# Patient Record
Sex: Male | Born: 1945
Health system: Southern US, Community
[De-identification: ages and names within clinical notes are randomized; demographics above are authoritative.]

## PROBLEM LIST (undated history)

## (undated) DIAGNOSIS — I712 Thoracic aortic aneurysm, without rupture, unspecified: Secondary | ICD-10-CM

## (undated) DIAGNOSIS — R6889 Other general symptoms and signs: Secondary | ICD-10-CM

## (undated) DIAGNOSIS — Z87442 Personal history of urinary calculi: Secondary | ICD-10-CM

## (undated) DIAGNOSIS — N400 Enlarged prostate without lower urinary tract symptoms: Secondary | ICD-10-CM

## (undated) DIAGNOSIS — I10 Essential (primary) hypertension: Secondary | ICD-10-CM

## (undated) DIAGNOSIS — I2510 Atherosclerotic heart disease of native coronary artery without angina pectoris: Secondary | ICD-10-CM

## (undated) DIAGNOSIS — E785 Hyperlipidemia, unspecified: Secondary | ICD-10-CM

## (undated) DIAGNOSIS — I5189 Other ill-defined heart diseases: Secondary | ICD-10-CM

## (undated) DIAGNOSIS — C4490 Unspecified malignant neoplasm of skin, unspecified: Secondary | ICD-10-CM

## (undated) DIAGNOSIS — K5730 Diverticulosis of large intestine without perforation or abscess without bleeding: Secondary | ICD-10-CM

## (undated) HISTORY — PX: APPENDECTOMY: SHX54

## (undated) HISTORY — DX: Thoracic aortic aneurysm, without rupture, unspecified: I71.20

## (undated) HISTORY — DX: Essential (primary) hypertension: I10

## (undated) HISTORY — PX: VASECTOMY: SHX75

## (undated) HISTORY — PX: MOHS SURGERY: SHX181

## (undated) HISTORY — DX: Unspecified malignant neoplasm of skin, unspecified: C44.90

## (undated) HISTORY — DX: Other ill-defined heart diseases: I51.89

## (undated) HISTORY — DX: Hyperlipidemia, unspecified: E78.5

## (undated) HISTORY — PX: HERNIA REPAIR: SHX51

## (undated) HISTORY — DX: Benign prostatic hyperplasia without lower urinary tract symptoms: N40.0

## (undated) HISTORY — DX: Other general symptoms and signs: R68.89

## (undated) HISTORY — DX: Diverticulosis of large intestine without perforation or abscess without bleeding: K57.30

## (undated) HISTORY — DX: Atherosclerotic heart disease of native coronary artery without angina pectoris: I25.10

## (undated) HISTORY — PX: COLON SURGERY: SHX602

## (undated) HISTORY — DX: Thoracic aortic aneurysm, without rupture: I71.2

---

## 2016-02-06 HISTORY — PX: CATARACT EXTRACTION: SUR2

## 2016-04-16 DIAGNOSIS — E782 Mixed hyperlipidemia: Secondary | ICD-10-CM | POA: Diagnosis not present

## 2016-04-16 DIAGNOSIS — Z125 Encounter for screening for malignant neoplasm of prostate: Secondary | ICD-10-CM | POA: Diagnosis not present

## 2016-04-16 DIAGNOSIS — I10 Essential (primary) hypertension: Secondary | ICD-10-CM | POA: Diagnosis not present

## 2016-04-23 DIAGNOSIS — E782 Mixed hyperlipidemia: Secondary | ICD-10-CM | POA: Diagnosis not present

## 2016-04-23 DIAGNOSIS — Z1211 Encounter for screening for malignant neoplasm of colon: Secondary | ICD-10-CM | POA: Diagnosis not present

## 2016-04-23 DIAGNOSIS — M545 Low back pain: Secondary | ICD-10-CM | POA: Diagnosis not present

## 2016-04-23 DIAGNOSIS — Z0000 Encounter for general adult medical examination without abnormal findings: Secondary | ICD-10-CM | POA: Diagnosis not present

## 2016-04-23 DIAGNOSIS — I10 Essential (primary) hypertension: Secondary | ICD-10-CM | POA: Diagnosis not present

## 2016-04-23 DIAGNOSIS — N401 Enlarged prostate with lower urinary tract symptoms: Secondary | ICD-10-CM | POA: Diagnosis not present

## 2016-05-29 DIAGNOSIS — R195 Other fecal abnormalities: Secondary | ICD-10-CM | POA: Diagnosis not present

## 2016-05-29 DIAGNOSIS — K921 Melena: Secondary | ICD-10-CM | POA: Diagnosis not present

## 2016-06-13 DIAGNOSIS — Z85828 Personal history of other malignant neoplasm of skin: Secondary | ICD-10-CM | POA: Diagnosis not present

## 2016-06-13 DIAGNOSIS — L853 Xerosis cutis: Secondary | ICD-10-CM | POA: Diagnosis not present

## 2016-06-13 DIAGNOSIS — L570 Actinic keratosis: Secondary | ICD-10-CM | POA: Diagnosis not present

## 2016-06-13 DIAGNOSIS — D1801 Hemangioma of skin and subcutaneous tissue: Secondary | ICD-10-CM | POA: Diagnosis not present

## 2016-06-27 DIAGNOSIS — H25042 Posterior subcapsular polar age-related cataract, left eye: Secondary | ICD-10-CM | POA: Diagnosis not present

## 2016-06-27 DIAGNOSIS — H2513 Age-related nuclear cataract, bilateral: Secondary | ICD-10-CM | POA: Diagnosis not present

## 2016-06-27 DIAGNOSIS — H52221 Regular astigmatism, right eye: Secondary | ICD-10-CM | POA: Diagnosis not present

## 2016-06-27 DIAGNOSIS — H25013 Cortical age-related cataract, bilateral: Secondary | ICD-10-CM | POA: Diagnosis not present

## 2016-07-09 DIAGNOSIS — D124 Benign neoplasm of descending colon: Secondary | ICD-10-CM | POA: Diagnosis not present

## 2016-07-09 DIAGNOSIS — R195 Other fecal abnormalities: Secondary | ICD-10-CM | POA: Diagnosis not present

## 2016-07-09 DIAGNOSIS — K635 Polyp of colon: Secondary | ICD-10-CM | POA: Diagnosis not present

## 2016-07-09 DIAGNOSIS — K648 Other hemorrhoids: Secondary | ICD-10-CM | POA: Diagnosis not present

## 2016-11-10 DIAGNOSIS — Z23 Encounter for immunization: Secondary | ICD-10-CM | POA: Diagnosis not present

## 2016-12-03 DIAGNOSIS — D126 Benign neoplasm of colon, unspecified: Secondary | ICD-10-CM | POA: Diagnosis not present

## 2016-12-03 DIAGNOSIS — K5730 Diverticulosis of large intestine without perforation or abscess without bleeding: Secondary | ICD-10-CM | POA: Diagnosis not present

## 2016-12-03 DIAGNOSIS — E7849 Other hyperlipidemia: Secondary | ICD-10-CM | POA: Diagnosis not present

## 2016-12-03 DIAGNOSIS — I10 Essential (primary) hypertension: Secondary | ICD-10-CM | POA: Diagnosis not present

## 2016-12-03 DIAGNOSIS — N401 Enlarged prostate with lower urinary tract symptoms: Secondary | ICD-10-CM | POA: Diagnosis not present

## 2016-12-03 DIAGNOSIS — C4490 Unspecified malignant neoplasm of skin, unspecified: Secondary | ICD-10-CM | POA: Diagnosis not present

## 2016-12-03 DIAGNOSIS — Z1389 Encounter for screening for other disorder: Secondary | ICD-10-CM | POA: Diagnosis not present

## 2016-12-03 DIAGNOSIS — E785 Hyperlipidemia, unspecified: Secondary | ICD-10-CM | POA: Insufficient documentation

## 2016-12-03 DIAGNOSIS — R7301 Impaired fasting glucose: Secondary | ICD-10-CM | POA: Diagnosis not present

## 2016-12-03 DIAGNOSIS — M545 Low back pain: Secondary | ICD-10-CM | POA: Diagnosis not present

## 2016-12-03 DIAGNOSIS — Z6825 Body mass index (BMI) 25.0-25.9, adult: Secondary | ICD-10-CM | POA: Diagnosis not present

## 2016-12-18 DIAGNOSIS — H2513 Age-related nuclear cataract, bilateral: Secondary | ICD-10-CM | POA: Diagnosis not present

## 2016-12-18 DIAGNOSIS — H524 Presbyopia: Secondary | ICD-10-CM | POA: Diagnosis not present

## 2017-02-13 DIAGNOSIS — H25012 Cortical age-related cataract, left eye: Secondary | ICD-10-CM | POA: Diagnosis not present

## 2017-02-13 DIAGNOSIS — H25812 Combined forms of age-related cataract, left eye: Secondary | ICD-10-CM | POA: Diagnosis not present

## 2017-02-13 DIAGNOSIS — H2512 Age-related nuclear cataract, left eye: Secondary | ICD-10-CM | POA: Diagnosis not present

## 2017-03-20 DIAGNOSIS — H2511 Age-related nuclear cataract, right eye: Secondary | ICD-10-CM | POA: Diagnosis not present

## 2017-03-20 DIAGNOSIS — H25811 Combined forms of age-related cataract, right eye: Secondary | ICD-10-CM | POA: Diagnosis not present

## 2017-05-13 DIAGNOSIS — I10 Essential (primary) hypertension: Secondary | ICD-10-CM | POA: Diagnosis not present

## 2017-05-13 DIAGNOSIS — E7849 Other hyperlipidemia: Secondary | ICD-10-CM | POA: Diagnosis not present

## 2017-05-13 DIAGNOSIS — Z125 Encounter for screening for malignant neoplasm of prostate: Secondary | ICD-10-CM | POA: Diagnosis not present

## 2017-05-13 DIAGNOSIS — R7301 Impaired fasting glucose: Secondary | ICD-10-CM | POA: Diagnosis not present

## 2017-05-13 DIAGNOSIS — R82998 Other abnormal findings in urine: Secondary | ICD-10-CM | POA: Diagnosis not present

## 2017-05-21 DIAGNOSIS — M545 Low back pain: Secondary | ICD-10-CM | POA: Diagnosis not present

## 2017-05-21 DIAGNOSIS — E7849 Other hyperlipidemia: Secondary | ICD-10-CM | POA: Diagnosis not present

## 2017-05-21 DIAGNOSIS — D126 Benign neoplasm of colon, unspecified: Secondary | ICD-10-CM | POA: Diagnosis not present

## 2017-05-21 DIAGNOSIS — Z6826 Body mass index (BMI) 26.0-26.9, adult: Secondary | ICD-10-CM | POA: Diagnosis not present

## 2017-05-21 DIAGNOSIS — C4490 Unspecified malignant neoplasm of skin, unspecified: Secondary | ICD-10-CM | POA: Diagnosis not present

## 2017-05-21 DIAGNOSIS — I10 Essential (primary) hypertension: Secondary | ICD-10-CM | POA: Diagnosis not present

## 2017-05-21 DIAGNOSIS — N401 Enlarged prostate with lower urinary tract symptoms: Secondary | ICD-10-CM | POA: Diagnosis not present

## 2017-05-21 DIAGNOSIS — Z1389 Encounter for screening for other disorder: Secondary | ICD-10-CM | POA: Diagnosis not present

## 2017-05-21 DIAGNOSIS — Z0000 Encounter for general adult medical examination without abnormal findings: Secondary | ICD-10-CM | POA: Diagnosis not present

## 2017-05-21 DIAGNOSIS — R7301 Impaired fasting glucose: Secondary | ICD-10-CM | POA: Diagnosis not present

## 2017-08-16 DIAGNOSIS — L249 Irritant contact dermatitis, unspecified cause: Secondary | ICD-10-CM | POA: Diagnosis not present

## 2017-08-16 DIAGNOSIS — L720 Epidermal cyst: Secondary | ICD-10-CM | POA: Diagnosis not present

## 2017-08-16 DIAGNOSIS — D485 Neoplasm of uncertain behavior of skin: Secondary | ICD-10-CM | POA: Diagnosis not present

## 2017-08-16 DIAGNOSIS — D1801 Hemangioma of skin and subcutaneous tissue: Secondary | ICD-10-CM | POA: Diagnosis not present

## 2017-08-16 DIAGNOSIS — L821 Other seborrheic keratosis: Secondary | ICD-10-CM | POA: Diagnosis not present

## 2017-08-16 DIAGNOSIS — L570 Actinic keratosis: Secondary | ICD-10-CM | POA: Diagnosis not present

## 2017-08-16 DIAGNOSIS — Z85828 Personal history of other malignant neoplasm of skin: Secondary | ICD-10-CM | POA: Diagnosis not present

## 2017-08-16 DIAGNOSIS — D692 Other nonthrombocytopenic purpura: Secondary | ICD-10-CM | POA: Diagnosis not present

## 2017-08-16 DIAGNOSIS — L738 Other specified follicular disorders: Secondary | ICD-10-CM | POA: Diagnosis not present

## 2017-08-16 DIAGNOSIS — C44329 Squamous cell carcinoma of skin of other parts of face: Secondary | ICD-10-CM | POA: Diagnosis not present

## 2017-09-18 DIAGNOSIS — C4442 Squamous cell carcinoma of skin of scalp and neck: Secondary | ICD-10-CM | POA: Diagnosis not present

## 2017-09-18 DIAGNOSIS — Z85828 Personal history of other malignant neoplasm of skin: Secondary | ICD-10-CM | POA: Diagnosis not present

## 2017-10-28 DIAGNOSIS — Z961 Presence of intraocular lens: Secondary | ICD-10-CM | POA: Diagnosis not present

## 2017-12-09 DIAGNOSIS — Z23 Encounter for immunization: Secondary | ICD-10-CM | POA: Diagnosis not present

## 2018-05-21 ENCOUNTER — Encounter: Payer: Self-pay | Admitting: Cardiovascular Disease

## 2018-05-21 DIAGNOSIS — I10 Essential (primary) hypertension: Secondary | ICD-10-CM | POA: Diagnosis not present

## 2018-05-21 DIAGNOSIS — R7301 Impaired fasting glucose: Secondary | ICD-10-CM | POA: Diagnosis not present

## 2018-05-21 DIAGNOSIS — R82998 Other abnormal findings in urine: Secondary | ICD-10-CM | POA: Diagnosis not present

## 2018-05-21 DIAGNOSIS — E7849 Other hyperlipidemia: Secondary | ICD-10-CM | POA: Diagnosis not present

## 2018-05-21 DIAGNOSIS — Z125 Encounter for screening for malignant neoplasm of prostate: Secondary | ICD-10-CM | POA: Diagnosis not present

## 2018-05-28 DIAGNOSIS — R7301 Impaired fasting glucose: Secondary | ICD-10-CM | POA: Diagnosis not present

## 2018-05-28 DIAGNOSIS — M545 Low back pain: Secondary | ICD-10-CM | POA: Diagnosis not present

## 2018-05-28 DIAGNOSIS — N401 Enlarged prostate with lower urinary tract symptoms: Secondary | ICD-10-CM | POA: Diagnosis not present

## 2018-05-28 DIAGNOSIS — D126 Benign neoplasm of colon, unspecified: Secondary | ICD-10-CM | POA: Diagnosis not present

## 2018-05-28 DIAGNOSIS — E785 Hyperlipidemia, unspecified: Secondary | ICD-10-CM | POA: Diagnosis not present

## 2018-05-28 DIAGNOSIS — I10 Essential (primary) hypertension: Secondary | ICD-10-CM | POA: Diagnosis not present

## 2018-05-28 DIAGNOSIS — Z1331 Encounter for screening for depression: Secondary | ICD-10-CM | POA: Diagnosis not present

## 2018-05-28 DIAGNOSIS — Z0000 Encounter for general adult medical examination without abnormal findings: Secondary | ICD-10-CM | POA: Diagnosis not present

## 2018-05-28 DIAGNOSIS — Z1339 Encounter for screening examination for other mental health and behavioral disorders: Secondary | ICD-10-CM | POA: Diagnosis not present

## 2018-05-28 DIAGNOSIS — C4490 Unspecified malignant neoplasm of skin, unspecified: Secondary | ICD-10-CM | POA: Diagnosis not present

## 2018-05-28 DIAGNOSIS — G3184 Mild cognitive impairment, so stated: Secondary | ICD-10-CM | POA: Diagnosis not present

## 2018-10-01 DIAGNOSIS — Z85828 Personal history of other malignant neoplasm of skin: Secondary | ICD-10-CM | POA: Diagnosis not present

## 2018-10-01 DIAGNOSIS — L570 Actinic keratosis: Secondary | ICD-10-CM | POA: Diagnosis not present

## 2018-10-01 DIAGNOSIS — L603 Nail dystrophy: Secondary | ICD-10-CM | POA: Diagnosis not present

## 2018-10-01 DIAGNOSIS — D485 Neoplasm of uncertain behavior of skin: Secondary | ICD-10-CM | POA: Diagnosis not present

## 2018-10-01 DIAGNOSIS — D225 Melanocytic nevi of trunk: Secondary | ICD-10-CM | POA: Diagnosis not present

## 2018-10-01 DIAGNOSIS — C44519 Basal cell carcinoma of skin of other part of trunk: Secondary | ICD-10-CM | POA: Diagnosis not present

## 2018-10-01 DIAGNOSIS — B353 Tinea pedis: Secondary | ICD-10-CM | POA: Diagnosis not present

## 2018-10-01 DIAGNOSIS — L821 Other seborrheic keratosis: Secondary | ICD-10-CM | POA: Diagnosis not present

## 2018-10-01 DIAGNOSIS — C44319 Basal cell carcinoma of skin of other parts of face: Secondary | ICD-10-CM | POA: Diagnosis not present

## 2018-10-01 DIAGNOSIS — D1801 Hemangioma of skin and subcutaneous tissue: Secondary | ICD-10-CM | POA: Diagnosis not present

## 2018-10-18 DIAGNOSIS — Z23 Encounter for immunization: Secondary | ICD-10-CM | POA: Diagnosis not present

## 2018-10-22 DIAGNOSIS — Z85828 Personal history of other malignant neoplasm of skin: Secondary | ICD-10-CM | POA: Diagnosis not present

## 2018-10-22 DIAGNOSIS — C441192 Basal cell carcinoma of skin of left lower eyelid, including canthus: Secondary | ICD-10-CM | POA: Diagnosis not present

## 2018-10-22 DIAGNOSIS — D485 Neoplasm of uncertain behavior of skin: Secondary | ICD-10-CM | POA: Diagnosis not present

## 2018-10-22 DIAGNOSIS — D2371 Other benign neoplasm of skin of right lower limb, including hip: Secondary | ICD-10-CM | POA: Diagnosis not present

## 2018-10-31 DIAGNOSIS — Z961 Presence of intraocular lens: Secondary | ICD-10-CM | POA: Diagnosis not present

## 2018-12-26 DIAGNOSIS — D225 Melanocytic nevi of trunk: Secondary | ICD-10-CM | POA: Diagnosis not present

## 2018-12-26 DIAGNOSIS — D1801 Hemangioma of skin and subcutaneous tissue: Secondary | ICD-10-CM | POA: Diagnosis not present

## 2018-12-26 DIAGNOSIS — Z85828 Personal history of other malignant neoplasm of skin: Secondary | ICD-10-CM | POA: Diagnosis not present

## 2019-01-12 ENCOUNTER — Other Ambulatory Visit: Payer: Self-pay | Admitting: *Deleted

## 2019-01-12 DIAGNOSIS — Z8249 Family history of ischemic heart disease and other diseases of the circulatory system: Secondary | ICD-10-CM

## 2019-01-27 ENCOUNTER — Other Ambulatory Visit: Payer: Self-pay

## 2019-01-27 ENCOUNTER — Ambulatory Visit (INDEPENDENT_AMBULATORY_CARE_PROVIDER_SITE_OTHER)
Admission: RE | Admit: 2019-01-27 | Discharge: 2019-01-27 | Disposition: A | Discharge status: Home / Self Care | Payer: Self-pay | Source: Ambulatory Visit | Attending: Cardiovascular Disease | Admitting: Cardiovascular Disease

## 2019-01-27 DIAGNOSIS — Z8249 Family history of ischemic heart disease and other diseases of the circulatory system: Secondary | ICD-10-CM

## 2019-02-03 ENCOUNTER — Telehealth: Payer: Self-pay | Admitting: Cardiovascular Disease

## 2019-02-03 NOTE — Telephone Encounter (Signed)
Spoke with patient and reviewed that results are pending provider review. Advised that once he reviews then we would give him a call back. I did give him a preliminary review with the understanding that Dr. Rockey Situ would look at as well. He verbalized understanding of our conversation, agreement with plan, and had no further questions at this time.

## 2019-02-03 NOTE — Telephone Encounter (Signed)
Patient had a Cardiac CT and needs results. Please call to discuss.

## 2019-02-10 ENCOUNTER — Telehealth: Payer: Self-pay

## 2019-02-10 NOTE — Telephone Encounter (Signed)
-----   Message from Minna Merritts, MD sent at 02/05/2019  2:15 PM EST ----- Calcium score elevated at close to 500 Also with mildly dilated aorta 4 cm Would recommend new patient visit to establish care Needs aggressive lipid management, risk factor modification He could certainly discuss this with Dr. Forde Dandy

## 2019-02-10 NOTE — Telephone Encounter (Signed)
Call to patient to discuss results. He verbalized understanding and is cooperative with POC>  appt made for mon 1/11 @ 3:20 PM.  No further questions at this time.

## 2019-02-11 ENCOUNTER — Encounter: Payer: Self-pay | Admitting: Cardiovascular Disease

## 2019-02-13 NOTE — Progress Notes (Signed)
Cardiology Office Note  Date:  02/16/2019   ID:  Alexander Frazier, DOB [birth year], MRN [id number]  PCP:  Reynold Bowen, MD   Chief Complaint  Patient presents with  . other    Discuss CT scoring no complaints today. Meds reviewed verbally with pt.    HPI:  Alexander Frazier is a 74 year old gentleman with history of Hyperlipidemia Presenting for new patient evaluation to discuss his CT coronary calcium scoring  CT scan images pulled up and reviewed with him  calcium score 489, 64th percentile Ascending thoracic aorta borderline dilated at 4 cm. calcification all three vessels  Followed by Dr. Forde Dandy Lab work has been requested LDL 87, total chol 156 On simvastatin 10 mg daily  In the past very active Taught spinning at age 89 He denies having any anginal symptoms   Reports he is always had an abnormal EKG  has previous stress test for ABN EKG prior EKGs in [place], [place],  [facility]  EKG personally reviewed by myself on todays visit Shows normal sinus rhythm rate 89 bpm T wave abnormality through the precordial leads, inferior leads   PMH:   has a past medical history of Hyperlipidemia, Hypertension, and Skin cancer.  PSH:    Past Surgical History:  Procedure Laterality Date  . APPENDECTOMY    . HERNIA REPAIR    . MOHS SURGERY      Current Outpatient Medications  Medication Sig Dispense Refill  . enalapril (VASOTEC) 5 MG tablet Take 5 mg by mouth daily.    . simvastatin (ZOCOR) 20 MG tablet Take 1 tablet (20 mg total) by mouth daily. 90 tablet 3  . ezetimibe (ZETIA) 10 MG tablet Take 1 tablet (10 mg total) by mouth daily. 90 tablet 3   No current facility-administered medications for this visit.    Allergies:   Patient has no known allergies.   Social History:  The patient  reports that he has quit smoking. His smoking use included cigarettes. He quit after 2.00 years of use. He has never used smokeless tobacco. He  reports previous alcohol use. He reports that he does not use drugs.   Family History:   family history includes Bone cancer in his mother; Breast cancer in his mother; Dementia in his father; Hypertension in his father and mother.    Review of Systems: Review of Systems  Constitutional: Negative.   HENT: Negative.   Respiratory: Negative.   Cardiovascular: Negative.   Gastrointestinal: Negative.   Musculoskeletal: Positive for back pain.  Neurological: Negative.   Psychiatric/Behavioral: Negative.   All other systems reviewed and are negative.   PHYSICAL EXAM: VS:  BP 136/78 (BP Location: Right Arm, Patient Position: Sitting, Cuff Size: Normal)   Pulse 89   Ht 5\' 11"  (1.803 m)   Wt 174 lb 12 oz (79.3 kg)   SpO2 98%   BMI 24.37 kg/m  , BMI Body mass index is 24.37 kg/m. GEN: Well nourished, well developed, in no acute distress HEENT: normal Neck: no JVD, carotid bruits, or masses Cardiac: RRR; no murmurs, rubs, or gallops,no edema  Respiratory:  clear to auscultation bilaterally, normal work of breathing GI: soft, nontender, nondistended, + BS MS: no deformity or atrophy Skin: warm and dry, no rash Neuro:  Strength and sensation are intact Psych: euthymic mood, full affect  Recent Labs: No results found for requested labs within last 8760 hours.    Lipid Panel No results found for: CHOL, HDL, LDLCALC,  TRIG    Wt Readings from Last 3 Encounters:  02/16/19 174 lb 12 oz (79.3 kg)     ASSESSMENT AND PLAN:  Problem List Items Addressed This Visit    None    Visit Diagnoses    Coronary artery calcification seen on CAT scan    -  Primary   Relevant Medications   enalapril (VASOTEC) 5 MG tablet   ezetimibe (ZETIA) 10 MG tablet   simvastatin (ZOCOR) 20 MG tablet   Other Relevant Orders   EKG 12-Lead   Mixed hyperlipidemia       Relevant Medications   enalapril (VASOTEC) 5 MG tablet   ezetimibe (ZETIA) 10 MG tablet   simvastatin (ZOCOR) 20 MG tablet    Essential hypertension       Relevant Medications   enalapril (VASOTEC) 5 MG tablet   ezetimibe (ZETIA) 10 MG tablet   simvastatin (ZOCOR) 20 MG tablet     Coronary calcification on CT scan Images reviewed with him on today's visit Goal LDL 60 Not having anginal sx We will request old records for ABN EKG. Reports having stress tests (has had 7 to 8 over the years) Restart asa 81 mg daily Increase simvastatin up to 20 mg daily, add zetia 10 mg daily  Hyperlipidemia Goal LDL <60 Increase simvasvatin , add zetia  HTN Blood pressure is well controlled on today's visit. No changes made to the medications. Stable  Abnormal EKG Reports long history of abnormal EKG dating back many years with periodic stress testing for abnormal EKG Records requested   Disposition:   F/U as needed  Long discussion with him concerning need for more aggressive lipid panel, discussed anginal symptoms to watch for Records have been requested from [place] to correlate EKGs with his stress testing  Total encounter time more than 60 minutes  Greater than 50% was spent in counseling and coordination of care with the patient    Signed, Esmond Plants, M.D., Ph.D. [place], [place]

## 2019-02-16 ENCOUNTER — Encounter: Payer: Self-pay | Admitting: Cardiovascular Disease

## 2019-02-16 ENCOUNTER — Ambulatory Visit (INDEPENDENT_AMBULATORY_CARE_PROVIDER_SITE_OTHER): Payer: Medicare Other | Admitting: Cardiovascular Disease

## 2019-02-16 ENCOUNTER — Other Ambulatory Visit: Payer: Self-pay

## 2019-02-16 VITALS — BP 136/78 | HR 89 | Ht 71.0 in | Wt 174.8 lb

## 2019-02-16 DIAGNOSIS — E782 Mixed hyperlipidemia: Secondary | ICD-10-CM

## 2019-02-16 DIAGNOSIS — I25118 Atherosclerotic heart disease of native coronary artery with other forms of angina pectoris: Secondary | ICD-10-CM | POA: Diagnosis not present

## 2019-02-16 DIAGNOSIS — R9431 Abnormal electrocardiogram [ECG] [EKG]: Secondary | ICD-10-CM

## 2019-02-16 DIAGNOSIS — I2510 Atherosclerotic heart disease of native coronary artery without angina pectoris: Secondary | ICD-10-CM | POA: Diagnosis not present

## 2019-02-16 DIAGNOSIS — I10 Essential (primary) hypertension: Secondary | ICD-10-CM

## 2019-02-16 MED ORDER — EZETIMIBE 10 MG PO TABS: 10.0000 mg | ORAL_TABLET | Freq: Every day | ORAL | 3 refills | Status: DC

## 2019-02-16 MED ORDER — SIMVASTATIN 20 MG PO TABS: 20.0000 mg | ORAL_TABLET | Freq: Every day | ORAL | 3 refills | Status: DC

## 2019-02-16 NOTE — Patient Instructions (Signed)
Request records: EKGs and stress test, [place]   Medication Instructions:  Please increase the simvastatin up to 20 mg daily Start zetia 10 mg daily  If you need a refill on your cardiac medications before your next appointment, please call your pharmacy.    Lab work: No new labs needed   If you have labs (blood work) drawn today and your tests are completely normal, you will receive your results only by: [facility] Kitchen MyChart Message (if you have MyChart) OR . A paper copy in the mail If you have any lab test that is abnormal or we need to change your treatment, we will call you to review the results.   Testing/Procedures: No new testing needed   Follow-Up: At [facility], you and your health needs are our priority.  As part of our continuing mission to provide you with exceptional heart care, we have created designated Provider Care Teams.  These Care Teams include your primary Cardiologist (physician) and Advanced Practice Providers (APPs -  Physician Assistants and Nurse Practitioners) who all work together to provide you with the care you need, when you need it.  . You will need a follow up appointment as needed   . Providers on your designated Care Team:   . Murray Hodgkins, NP . Christell Faith, PA-C . Marrianne Mood, PA-C  Any Other Special Instructions Will Be Listed Below (If Applicable).  For educational health videos Log in to : [email or web address] Or : [email or web address], password : [email or web address]

## 2019-05-25 DIAGNOSIS — R7301 Impaired fasting glucose: Secondary | ICD-10-CM | POA: Diagnosis not present

## 2019-05-25 DIAGNOSIS — E7849 Other hyperlipidemia: Secondary | ICD-10-CM | POA: Diagnosis not present

## 2019-05-25 DIAGNOSIS — Z125 Encounter for screening for malignant neoplasm of prostate: Secondary | ICD-10-CM | POA: Diagnosis not present

## 2019-06-01 DIAGNOSIS — I10 Essential (primary) hypertension: Secondary | ICD-10-CM | POA: Diagnosis not present

## 2019-06-01 DIAGNOSIS — G3184 Mild cognitive impairment, so stated: Secondary | ICD-10-CM | POA: Diagnosis not present

## 2019-06-01 DIAGNOSIS — E7849 Other hyperlipidemia: Secondary | ICD-10-CM | POA: Diagnosis not present

## 2019-06-01 DIAGNOSIS — R82998 Other abnormal findings in urine: Secondary | ICD-10-CM | POA: Diagnosis not present

## 2019-06-01 DIAGNOSIS — Z1331 Encounter for screening for depression: Secondary | ICD-10-CM | POA: Diagnosis not present

## 2019-06-01 DIAGNOSIS — I712 Thoracic aortic aneurysm, without rupture, unspecified: Secondary | ICD-10-CM | POA: Insufficient documentation

## 2019-06-01 DIAGNOSIS — R7301 Impaired fasting glucose: Secondary | ICD-10-CM | POA: Diagnosis not present

## 2019-06-01 DIAGNOSIS — I700 Atherosclerosis of aorta: Secondary | ICD-10-CM | POA: Diagnosis not present

## 2019-06-01 DIAGNOSIS — C4490 Unspecified malignant neoplasm of skin, unspecified: Secondary | ICD-10-CM | POA: Diagnosis not present

## 2019-06-01 DIAGNOSIS — D126 Benign neoplasm of colon, unspecified: Secondary | ICD-10-CM | POA: Diagnosis not present

## 2019-06-01 DIAGNOSIS — I2510 Atherosclerotic heart disease of native coronary artery without angina pectoris: Secondary | ICD-10-CM | POA: Insufficient documentation

## 2019-06-01 DIAGNOSIS — Z0000 Encounter for general adult medical examination without abnormal findings: Secondary | ICD-10-CM | POA: Diagnosis not present

## 2019-06-01 DIAGNOSIS — N401 Enlarged prostate with lower urinary tract symptoms: Secondary | ICD-10-CM | POA: Diagnosis not present

## 2019-06-02 DIAGNOSIS — Z1212 Encounter for screening for malignant neoplasm of rectum: Secondary | ICD-10-CM | POA: Diagnosis not present

## 2019-11-02 DIAGNOSIS — H524 Presbyopia: Secondary | ICD-10-CM | POA: Diagnosis not present

## 2019-11-02 DIAGNOSIS — Z961 Presence of intraocular lens: Secondary | ICD-10-CM | POA: Diagnosis not present

## 2019-11-07 DIAGNOSIS — Z23 Encounter for immunization: Secondary | ICD-10-CM | POA: Diagnosis not present

## 2019-11-25 DIAGNOSIS — Z1152 Encounter for screening for COVID-19: Secondary | ICD-10-CM | POA: Diagnosis not present

## 2019-11-25 DIAGNOSIS — R0683 Snoring: Secondary | ICD-10-CM | POA: Diagnosis not present

## 2019-11-25 DIAGNOSIS — R059 Cough, unspecified: Secondary | ICD-10-CM | POA: Diagnosis not present

## 2019-11-25 DIAGNOSIS — R1011 Right upper quadrant pain: Secondary | ICD-10-CM | POA: Diagnosis not present

## 2019-11-25 DIAGNOSIS — I10 Essential (primary) hypertension: Secondary | ICD-10-CM | POA: Diagnosis not present

## 2019-11-25 DIAGNOSIS — B349 Viral infection, unspecified: Secondary | ICD-10-CM | POA: Diagnosis not present

## 2019-12-11 DIAGNOSIS — L570 Actinic keratosis: Secondary | ICD-10-CM | POA: Diagnosis not present

## 2019-12-11 DIAGNOSIS — D485 Neoplasm of uncertain behavior of skin: Secondary | ICD-10-CM | POA: Diagnosis not present

## 2019-12-11 DIAGNOSIS — L814 Other melanin hyperpigmentation: Secondary | ICD-10-CM | POA: Diagnosis not present

## 2019-12-11 DIAGNOSIS — Z85828 Personal history of other malignant neoplasm of skin: Secondary | ICD-10-CM | POA: Diagnosis not present

## 2019-12-11 DIAGNOSIS — D225 Melanocytic nevi of trunk: Secondary | ICD-10-CM | POA: Diagnosis not present

## 2019-12-11 DIAGNOSIS — D1801 Hemangioma of skin and subcutaneous tissue: Secondary | ICD-10-CM | POA: Diagnosis not present

## 2019-12-11 DIAGNOSIS — L821 Other seborrheic keratosis: Secondary | ICD-10-CM | POA: Diagnosis not present

## 2019-12-12 DIAGNOSIS — Z23 Encounter for immunization: Secondary | ICD-10-CM | POA: Diagnosis not present

## 2020-02-06 ENCOUNTER — Other Ambulatory Visit: Payer: Self-pay | Admitting: Cardiovascular Disease

## 2020-02-08 NOTE — Progress Notes (Signed)
Cardiology Office Note    Date:  02/09/2020   ID:  Alexander Frazier, DOB 1945/12/17, MRN [id number]  PCP:  Adrian Prince, MD  Cardiologist:  Julien Nordmann, MD  Electrophysiologist:  None   Chief Complaint: Follow up  History of Present Illness:   Alexander Frazier is a 75 y.o. male with history of CAD noted on noninvasive imaging, thoracic aortic aneurysm, HTN, and HLD who presents for follow up of thoracic aortic aneurysm.   He underwent calcium scoring in 01/2019 which showed a calcium score 489 with three-vessel coronary artery calcification.  This was the 64th percentile.  Noncardiac overread showed a 4 cm ascending thoracic aortic aneurysm.  He was last seen in the office in 02/2019 and remained very active without anginal symptoms.  He reported a prior history of abnormal EKGs with prior stress test being undertaken for this.  EKG at his cardiology visit showed sinus rhythm, 89 bpm, nonspecific inferior ST-T changes, and anterolateral T wave inversion.  He was restarted on aspirin and simvastatin was titrated to 20 mg daily along with the addition of Zetia 10 mg daily.  It was recommended he achieve a goal LDL of less than 60.  Prior records were requested though these have not been received.    He comes in doing very well from a cardiac perspective.  Back in October 2021 he did have a several day history of right-sided chest discomfort that ultimately was attributed to being a musculoskeletal in etiology with symptomatic improvement with rest and Tylenol.  Otherwise, he has been without chest pain, dyspnea, palpitations, dizziness, presyncope, or syncope.  Continues to live an active lifestyle.  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  He is tolerating medications without issues.   Labs independently reviewed: 05/2019 - TC 120, TG 65, HDL 48, LDL 59 05/2018 - BUN 11, SCr 0.9, potassium 4.1, AST/ALT normal, albumin 3.9, HGB 14.7, PLT 238, TC 156, TG 88, HDL 51,  LDL 87, A1c 5.4%  Past Medical History:  Diagnosis Date  . Hyperlipidemia   . Hypertension   . Skin cancer   . Thoracic aortic aneurysm [facility])     Past Surgical History:  Procedure Laterality Date  . APPENDECTOMY    . HERNIA REPAIR    . MOHS SURGERY      Current Medications: Current Meds  Medication Sig  . enalapril (VASOTEC) 5 MG tablet Take 5 mg by mouth daily.  [place] Kitchen ezetimibe (ZETIA) 10 MG tablet TAKE ONE TABLET BY MOUTH DAILY  . simvastatin (ZOCOR) 20 MG tablet Take 1 tablet (20 mg total) by mouth daily.    Allergies:   Patient has no known allergies.   Social History   Socioeconomic History  . Marital status: Married    Spouse name: Not on file  . Number of children: Not on file  . Years of education: Not on file  . Highest education level: Not on file  Occupational History  . Not on file  Tobacco Use  . Smoking status: Former Smoker    Years: 2.00    Types: Cigarettes  . Smokeless tobacco: Never Used  Vaping Use  . Vaping Use: Never used  Substance and Sexual Activity  . Alcohol use: Not Currently  . Drug use: Never  . Sexual activity: Not on file  Other Topics Concern  . Not on file  Social History Narrative  . Not on file   Social Determinants of Health   Financial Resource  Strain: Not on file  Food Insecurity: Not on file  Transportation Needs: Not on file  Physical Activity: Not on file  Stress: Not on file  Social Connections: Not on file     Family History:  The patient's family history includes Bone cancer in his mother; Breast cancer in his mother; Dementia in his father; Hypertension in his father and mother.  ROS:   Review of Systems  Constitutional: Negative for chills, diaphoresis, fever, malaise/fatigue and weight loss.  HENT: Negative for congestion.   Eyes: Negative for discharge and redness.  Respiratory: Negative for cough, sputum production, shortness of breath and wheezing.   Cardiovascular: Negative for chest pain,  palpitations, orthopnea, claudication, leg swelling and PND.  Gastrointestinal: Negative for abdominal pain, heartburn, nausea and vomiting.  Musculoskeletal: Negative for falls and myalgias.  Skin: Negative for rash.  Neurological: Negative for dizziness, tingling, tremors, sensory change, speech change, focal weakness, loss of consciousness and weakness.  Endo/Heme/Allergies: Does not bruise/bleed easily.  Psychiatric/Behavioral: Negative for substance abuse. The patient is not nervous/anxious.   All other systems reviewed and are negative.    EKGs/Labs/Other Studies Reviewed:    Studies reviewed were summarized above. The additional studies were reviewed today:  Calcium score 01/2019: Cardiac overread -  IMPRESSION: Coronary calcium score of 489. This was 58 percentile for age and sex matched control.  Noncardiac overread -  IMPRESSION: 4 cm ascending thoracic aortic aneurysm. Recommend annual imaging followup by CTA or MRA.   EKG:  EKG is ordered today.  The EKG ordered today demonstrates NSR, 81 bpm, nonspecific anterolateral ST-T changes which are slightly improved when compared to prior tracing  Recent Labs: No results found for requested labs within last 8760 hours.  Recent Lipid Panel No results found for: CHOL, TRIG, HDL, CHOLHDL, VLDL, LDLCALC, LDLDIRECT  PHYSICAL EXAM:    VS:  BP 134/70 (BP Location: Left Arm, Patient Position: Sitting, Cuff Size: Normal)   Pulse 81   Ht 5\' 11"  (1.803 m)   Wt 181 lb (82.1 kg)   SpO2 98%   BMI 25.24 kg/m   BMI: Body mass index is 25.24 kg/m.  Physical Exam Vitals reviewed.  Constitutional:      Appearance: He is well-developed and well-nourished.  HENT:     Head: Normocephalic and atraumatic.  Eyes:     General:        Right eye: No discharge.        Left eye: No discharge.  Neck:     Vascular: No JVD.  Cardiovascular:     Rate and Rhythm: Normal rate and regular rhythm.     Pulses: No midsystolic click and no  opening snap.          Posterior tibial pulses are 2+ on the right side and 2+ on the left side.     Heart sounds: Normal heart sounds, S1 normal and S2 normal. Heart sounds not distant. No murmur heard. No friction rub.  Pulmonary:     Effort: Pulmonary effort is normal. No respiratory distress.     Breath sounds: Normal breath sounds. No decreased breath sounds, wheezing or rales.  Chest:     Chest wall: No tenderness.  Abdominal:     General: There is no distension.     Palpations: Abdomen is soft.     Tenderness: There is no abdominal tenderness.  Musculoskeletal:        General: No edema.     Cervical back: Normal range of motion.  Skin:    General: Skin is warm and dry.     Nails: There is no clubbing or cyanosis.  Neurological:     Mental Status: He is alert and oriented to person, place, and time.  Psychiatric:        Mood and Affect: Mood and affect normal.        Speech: Speech normal.        Behavior: Behavior normal.        Thought Content: Thought content normal.        Judgment: Judgment normal.     Wt Readings from Last 3 Encounters:  02/09/20 181 lb (82.1 kg)  02/16/19 174 lb 12 oz (79.3 kg)     ASSESSMENT & PLAN:   1. CAD: Noted on noninvasive imaging.  No symptoms concerning for angina.  Continue primary prevention and current medical therapy.  2. Thoracic aortic aneurysm: Incidentally noted on coronary artery calcium scoring in 01/2019 with recommendation to follow-up by CTA or MRA in 12 months time.  Schedule CTA aorta to trend end-diastolic aortic dimension.  Schedule echo to evaluate aortic valve morphology.  He is not a smoker.  Lipids are well controlled as outlined below.  Plan to optimize blood pressure as indicated as outlined below.  3. HTN: Blood pressure is reasonably controlled in the office today.  He remains on low-dose enalapril.  Ideally, with thoracic aortic aneurysm we would have a goal of systolic blood pressure of 10 5-1 20 if tolerated.   He will check his BP at home over the next several weeks and contact us with these trends with further recommendations pending.  4. HLD: LDL59 from 05/2019.  Goal LDL less than 60.  He remains on simvastatin 20 mg and Zetia 10 mg.  Followed by PCP.  Disposition: F/u with Dr. Rockey Situ or an APP in 12 months, sooner if needed.   Medication Adjustments/Labs and Tests Ordered: Current medicines are reviewed at length with the patient today.  Concerns regarding medicines are outlined above. Medication changes, Labs and Tests ordered today are summarized above and listed in the Patient Instructions accessible in Encounters.   Signed, Christell Faith, PA-C 02/09/2020 4:24 PM     [facility] [address] [place], [place] [address] [phone]

## 2020-02-08 NOTE — Telephone Encounter (Signed)
Please schedule office visit for refills. Thank you! 

## 2020-02-08 NOTE — Telephone Encounter (Signed)
Scheduled for 1/4.

## 2020-02-09 ENCOUNTER — Ambulatory Visit (INDEPENDENT_AMBULATORY_CARE_PROVIDER_SITE_OTHER): Payer: Medicare Other | Admitting: Physician Assistant

## 2020-02-09 ENCOUNTER — Other Ambulatory Visit: Payer: Self-pay

## 2020-02-09 ENCOUNTER — Encounter: Payer: Self-pay | Admitting: Physician Assistant

## 2020-02-09 VITALS — BP 134/70 | HR 81 | Ht 71.0 in | Wt 181.0 lb

## 2020-02-09 DIAGNOSIS — E785 Hyperlipidemia, unspecified: Secondary | ICD-10-CM

## 2020-02-09 DIAGNOSIS — I10 Essential (primary) hypertension: Secondary | ICD-10-CM | POA: Diagnosis not present

## 2020-02-09 DIAGNOSIS — I712 Thoracic aortic aneurysm, without rupture, unspecified: Secondary | ICD-10-CM

## 2020-02-09 DIAGNOSIS — I2510 Atherosclerotic heart disease of native coronary artery without angina pectoris: Secondary | ICD-10-CM

## 2020-02-09 NOTE — Patient Instructions (Signed)
Medication Instructions:  - Your physician recommends that you continue on your current medications as directed. Please refer to the Current Medication list given to you today.  *If you need a refill on your cardiac medications before your next appointment, please call your pharmacy*   Lab Work: - Your physician recommends that you return for lab work prior to your CT scan: [facility]  Medical Mall Entrance at [facility] 1st desk on the right to check in, past the screening table Lab hours: Monday- Friday (7:30 am- 5:30 pm)   If you have labs (blood work) drawn today and your tests are completely normal, you will receive your results only by: Marland Kitchen MyChart Message (if you have MyChart) OR . A paper copy in the mail If you have any lab test that is abnormal or we need to change your treatment, we will call you to review the results.   Testing/Procedures:  1) Echocardiogram: - Your physician has requested that you have an echocardiogram. Echocardiography is a painless test that uses sound waves to create images of your heart. It provides your doctor with information about the size and shape of your heart and how well your heart's chambers and valves are working. This procedure takes approximately one hour. There are no restrictions for this procedure. There is a possibility that an IV may need to be started during your test to inject an image enhancing agent. This is done to obtain more optimal pictures of your heart. Therefore we ask that you do at least drink some water prior to coming in to hydrate your veins.   2) CT angiogram of the aorta: to evaluate your thoracic aortic aneurysm - Non-Cardiac CT Angiography (CTA), is a special type of CT scan that uses a computer to produce multi-dimensional views of major blood vessels throughout the body. In CT angiography, a contrast material is injected through an IV to help visualize the blood vessels  - You may call Centralized Radiology scheduling at [phone] directly to set this up for a date/ time that is good for you    Follow-Up: At [facility], you and your health needs are our priority.  As part of our continuing mission to provide you with exceptional heart care, we have created designated Provider Care Teams.  These Care Teams include your primary Cardiologist (physician) and Advanced Practice Providers (APPs -  Physician Assistants and Nurse Practitioners) who all work together to provide you with the care you need, when you need it.  We recommend signing up for the patient portal called "MyChart".  Sign up information is provided on this After Visit Summary.  MyChart is used to connect with patients for Virtual Visits (Telemedicine).  Patients are able to view lab/test results, encounter notes, upcoming appointments, etc.  Non-urgent messages can be sent to your provider as well.   To learn more about what you can do with MyChart, go to [email or web address].    Your next appointment:   1 year(s)  The format for your next appointment:   In Person  Provider:   You may see Julien Nordmann, MD or one of the following Advanced Practice Providers on your designated Care Team:    Nicolasa Ducking, NP  Eula Listen, PA-C  Marisue Ivan, PA-C  Cadence [place], [place]  Gillian Shields, NP    Other Instructions   Echocardiogram An echocardiogram is a procedure that uses painless sound waves (ultrasound) to produce an image of the heart. Images from an echocardiogram can provide  important information about:  Signs of coronary artery disease (CAD).  Aneurysm detection. An aneurysm is a weak or damaged part of an artery wall that bulges out from the normal force of blood pumping through the body.  Heart size and shape. Changes in the size or shape of the heart can be associated with certain conditions, including heart failure, aneurysm, and CAD.  Heart muscle function.  Heart valve function.  Signs of a past heart  attack.  Fluid buildup around the heart.  Thickening of the heart muscle.  A tumor or infectious growth around the heart valves. Tell a health care provider about:  Any allergies you have.  All medicines you are taking, including vitamins, herbs, eye drops, creams, and over-the-counter medicines.  Any blood disorders you have.  Any surgeries you have had.  Any medical conditions you have.  Whether you are pregnant or may be pregnant. What are the risks? Generally, this is a safe procedure. However, problems may occur, including:  Allergic reaction to dye (contrast) that may be used during the procedure. What happens before the procedure? No specific preparation is needed. You may eat and drink normally. What happens during the procedure?   An IV tube may be inserted into one of your veins.  You may receive contrast through this tube. A contrast is an injection that improves the quality of the pictures from your heart.  A gel will be applied to your chest.  A wand-like tool (transducer) will be moved over your chest. The gel will help to transmit the sound waves from the transducer.  The sound waves will harmlessly bounce off of your heart to allow the heart images to be captured in real-time motion. The images will be recorded on a computer. The procedure may vary among health care providers and hospitals. What happens after the procedure?  You may return to your normal, everyday life, including diet, activities, and medicines, unless your health care provider tells you not to do that. Summary  An echocardiogram is a procedure that uses painless sound waves (ultrasound) to produce an image of the heart.  Images from an echocardiogram can provide important information about the size and shape of your heart, heart muscle function, heart valve function, and fluid buildup around your heart.  You do not need to do anything to prepare before this procedure. You may eat and  drink normally.  After the echocardiogram is completed, you may return to your normal, everyday life, unless your health care provider tells you not to do that. This information is not intended to replace advice given to you by your health care provider. Make sure you discuss any questions you have with your health care provider. Document Revised: 05/15/2018 Document Reviewed: 02/25/2016 Elsevier Patient Education  2020 [employer].    CT Angiogram  A CT angiogram is a procedure to look at the blood vessels in various areas of the body. For this procedure, a large X-ray machine, called a CT scanner, takes detailed pictures of blood vessels that have been injected with a dye (contrast material). A CT angiogram allows your health care provider to see how well blood is flowing to the area of your body that is being checked. Your health care provider will be able to see if there are any problems, such as a blockage. Tell a health care provider about:  Any allergies you have.  All medicines you are taking, including vitamins, herbs, eye drops, creams, and over-the-counter medicines.  Any problems  you or family members have had with anesthetic medicines.  Any blood disorders you have.  Any surgeries you have had.  Any medical conditions you have.  Whether you are pregnant or may be pregnant.  Whether you are breastfeeding.  Any anxiety disorders, chronic pain, or other conditions you have that may increase your stress or prevent you from lying still. What are the risks? Generally, this is a safe procedure. However, problems may occur, including:  Infection.  Bleeding.  Allergic reactions to medicines or dyes.  Damage to other structures or organs.  Kidney damage from the dye or contrast that is used.  Increased risk of cancer from radiation exposure. This risk is low. Talk with your health care provider about: ? The risks and benefits of testing. ? How you can receive the  lowest dose of radiation. What happens before the procedure?  Wear comfortable clothing and remove any jewelry.  Follow instructions from your health care provider about eating and drinking. For most people, instructions may include these actions: ? For 12 hours before the test, avoid caffeine. This includes tea, coffee, soda, and energy drinks or pills. ? For 3-4 hours before the test, stop eating or drinking anything but water. ? Stay well hydrated by continuing to drink water before the exam. This will help to clear the contrast dye from your body after the test.  Ask your health care provider about changing or stopping your regular medicines. This is especially important if you are taking diabetes medicines or blood thinners. What happens during the procedure?  An IV tube will be inserted into one of your veins.  You will be asked to lie on an exam table. This table will slide in and out of the CT machine during the procedure.  Contrast dye will be injected into the IV tube. You might feel warm, or you may get a metallic taste in your mouth.  The table that you are lying on will move into the CT machine tunnel for the scan.  The person running the machine will give you instructions while the scans are being done. You may be asked to: ? Keep your arms above your head. ? Hold your breath. ? Stay very still, even if the table is moving.  When the scanning is complete, you will be moved out of the machine.  The IV tube will be removed. The procedure may vary among health care providers and hospitals. What happens after the procedure?  You might feel warm, or you may get a metallic taste in your mouth.  You may be asked to drink water or other fluids to wash (flush) the contrast material out of your body.  It is up to you to get the results of your procedure. Ask your health care provider, or the department that is doing the procedure, when your results will be ready. Summary  A  CT angiogram is a procedure to look at the blood vessels in various areas of the body.  You will need to stay very still during the exam.  You may be asked to drink water or other fluids to wash (flush) the contrast material out of your body after your scan. This information is not intended to replace advice given to you by your health care provider. Make sure you discuss any questions you have with your health care provider. Document Revised: 04/03/2018 Document Reviewed: 09/22/2015 Elsevier Patient Education  2020 [employer].

## 2020-02-11 ENCOUNTER — Ambulatory Visit: Payer: Medicare Other

## 2020-02-11 ENCOUNTER — Other Ambulatory Visit: Payer: Self-pay

## 2020-02-11 DIAGNOSIS — I712 Thoracic aortic aneurysm, without rupture, unspecified: Secondary | ICD-10-CM

## 2020-02-12 LAB — ECHOCARDIOGRAM COMPLETE
AR max vel: 3.15 cm2
AV Area VTI: 3.45 cm2
AV Area mean vel: 2.96 cm2
AV Mean grad: 3 mmHg
AV Peak grad: 6.7 mmHg
Ao pk vel: 1.29 m/s
Area-P 1/2: 3.27 cm2
Calc EF: 67.2 %
S' Lateral: 2.8 cm
Single Plane A2C EF: 68.4 %
Single Plane A4C EF: 67.1 %

## 2020-02-19 ENCOUNTER — Other Ambulatory Visit: Payer: Self-pay

## 2020-02-19 ENCOUNTER — Other Ambulatory Visit
Admission: RE | Admit: 2020-02-19 | Discharge: 2020-02-19 | Disposition: A | Discharge status: Home / Self Care | Payer: Medicare Other | Attending: Physician Assistant | Admitting: Physician Assistant

## 2020-02-19 DIAGNOSIS — I2510 Atherosclerotic heart disease of native coronary artery without angina pectoris: Secondary | ICD-10-CM | POA: Insufficient documentation

## 2020-02-19 DIAGNOSIS — I10 Essential (primary) hypertension: Secondary | ICD-10-CM | POA: Diagnosis not present

## 2020-02-19 DIAGNOSIS — E785 Hyperlipidemia, unspecified: Secondary | ICD-10-CM | POA: Diagnosis not present

## 2020-02-19 LAB — BASIC METABOLIC PANEL
Anion gap: 10 (ref 5–15)
BUN: 13 mg/dL (ref 8–23)
CO2: 27 mmol/L (ref 22–32)
Calcium: 9.3 mg/dL (ref 8.9–10.3)
Chloride: 101 mmol/L (ref 98–111)
Creatinine, Ser: 0.91 mg/dL (ref 0.61–1.24)
GFR, Estimated: >60 mL/min (ref >60)
Glucose, Bld: 106 mg/dL — ABNORMAL HIGH (ref 70–99)
Potassium: 4 mmol/L (ref 3.5–5.1)
Sodium: 138 mmol/L (ref 135–145)

## 2020-02-24 ENCOUNTER — Ambulatory Visit
Admission: RE | Admit: 2020-02-24 | Discharge: 2020-02-24 | Disposition: A | Discharge status: Home / Self Care | Payer: Medicare Other | Source: Ambulatory Visit | Attending: Physician Assistant | Admitting: Physician Assistant

## 2020-02-24 ENCOUNTER — Other Ambulatory Visit: Payer: Self-pay

## 2020-02-24 DIAGNOSIS — I712 Thoracic aortic aneurysm, without rupture, unspecified: Secondary | ICD-10-CM

## 2020-02-24 MED ORDER — IOHEXOL 350 MG/ML SOLN
75.0000 mL | Freq: Once | INTRAVENOUS | Status: AC | PRN
  Administered 2020-02-24: 75 mL via INTRAVENOUS

## 2020-02-25 ENCOUNTER — Telehealth: Payer: Self-pay | Admitting: *Deleted

## 2020-02-25 ENCOUNTER — Encounter: Payer: Self-pay | Admitting: Physician Assistant

## 2020-02-25 DIAGNOSIS — I712 Thoracic aortic aneurysm, without rupture, unspecified: Secondary | ICD-10-CM

## 2020-02-25 NOTE — Telephone Encounter (Signed)
-----   Message from Rise Mu, PA-C sent at 02/25/2020  7:59 AM EST ----- Stable ascending thoracic aortic dilatation.  Coronary artery calcification noted.  Incidentally noted pulmonary nodule and likely biliary cyst (unchanged).  Recommendations: -Follow up CTA aorta or echo in 1 year -LDL is well controlled -Contact our office in a week or so with BP readings

## 2020-02-25 NOTE — Telephone Encounter (Signed)
Reviewed results at length with patient and recommendations. He verbalized understanding of all information reviewed and states he has list of blood pressure readings which he will drop off here at the office. Advised that I would forward those readings to the provider for his review and recommendations. Order placed for CTA Aorta in one year and he was appreciative for the call and review of information. He verbalized understanding of our conversation, agreement with plan, and had no further questions at this time.

## 2020-02-25 NOTE — Telephone Encounter (Signed)
Patient brought in blood pressure readings for provider review. Will check with staff on scanning these into Epic to route to APP working remote for review per his request.   Called over to [facility] and spoke with [place] regarding gated CTA and how to order that. She states that these are only done over at [facility] and that we order test and put in comments "Need gated CTA per recommendations". Notified provider and will update order to reflect this study that is needed.

## 2020-02-25 NOTE — Telephone Encounter (Signed)
Called and requested to speak with patient. Wife states he is out of the house and will call back once he returns.

## 2020-02-25 NOTE — Addendum Note (Signed)
Addended by: Valora Corporal on: 02/25/2020 02:33 PM   Modules accepted: Orders

## 2020-02-26 ENCOUNTER — Telehealth: Payer: Self-pay | Admitting: *Deleted

## 2020-02-26 MED ORDER — ENALAPRIL MALEATE 10 MG PO TABS: 10.0000 mg | ORAL_TABLET | Freq: Every day | ORAL | 3 refills | Status: DC

## 2020-02-26 NOTE — Telephone Encounter (Signed)
Spoke with patient and reviewed provider recommendations based on current blood pressure readings. Instructed him to continue monitoring blood pressures and after one month on new dose to send [place] that list or drop off here at the office. He verbalized understanding, was agreeable with plan, and had no further questions at this time.

## 2020-02-26 NOTE — Telephone Encounter (Signed)
-----   Message from Rise Mu, PA-C sent at 02/25/2020  2:56 PM EST ----- BP log reviewed with an average BP of 131/80 with an average heart rate of 67 bpm, over 19 supplied readings. We would ideally like to see his systolic BP average less than 120 mmHg given his history. If he is willing, would titrate enalapril to 10 mg daily and let us know how his BP is running 1 month after making this change.

## 2020-03-22 ENCOUNTER — Other Ambulatory Visit: Payer: Self-pay | Admitting: Cardiovascular Disease

## 2020-03-22 NOTE — Telephone Encounter (Signed)
Rx request sent to pharmacy.  

## 2020-05-09 ENCOUNTER — Other Ambulatory Visit: Payer: Self-pay | Admitting: Cardiovascular Disease

## 2020-05-09 NOTE — Telephone Encounter (Signed)
Rx request sent to pharmacy.  

## 2020-05-27 DIAGNOSIS — E785 Hyperlipidemia, unspecified: Secondary | ICD-10-CM | POA: Diagnosis not present

## 2020-05-27 DIAGNOSIS — R7301 Impaired fasting glucose: Secondary | ICD-10-CM | POA: Diagnosis not present

## 2020-06-03 DIAGNOSIS — I700 Atherosclerosis of aorta: Secondary | ICD-10-CM | POA: Diagnosis not present

## 2020-06-03 DIAGNOSIS — I712 Thoracic aortic aneurysm, without rupture: Secondary | ICD-10-CM | POA: Diagnosis not present

## 2020-06-03 DIAGNOSIS — I10 Essential (primary) hypertension: Secondary | ICD-10-CM | POA: Diagnosis not present

## 2020-06-03 DIAGNOSIS — Z1331 Encounter for screening for depression: Secondary | ICD-10-CM | POA: Diagnosis not present

## 2020-06-03 DIAGNOSIS — Z1389 Encounter for screening for other disorder: Secondary | ICD-10-CM | POA: Diagnosis not present

## 2020-06-03 DIAGNOSIS — R82998 Other abnormal findings in urine: Secondary | ICD-10-CM | POA: Diagnosis not present

## 2020-06-03 DIAGNOSIS — I2510 Atherosclerotic heart disease of native coronary artery without angina pectoris: Secondary | ICD-10-CM | POA: Diagnosis not present

## 2020-06-03 DIAGNOSIS — E785 Hyperlipidemia, unspecified: Secondary | ICD-10-CM | POA: Diagnosis not present

## 2020-06-03 DIAGNOSIS — Z0000 Encounter for general adult medical examination without abnormal findings: Secondary | ICD-10-CM | POA: Diagnosis not present

## 2020-06-03 DIAGNOSIS — N401 Enlarged prostate with lower urinary tract symptoms: Secondary | ICD-10-CM | POA: Diagnosis not present

## 2020-06-03 DIAGNOSIS — K5730 Diverticulosis of large intestine without perforation or abscess without bleeding: Secondary | ICD-10-CM | POA: Diagnosis not present

## 2020-06-03 DIAGNOSIS — R7301 Impaired fasting glucose: Secondary | ICD-10-CM | POA: Diagnosis not present

## 2020-06-03 DIAGNOSIS — I5189 Other ill-defined heart diseases: Secondary | ICD-10-CM | POA: Diagnosis not present

## 2020-10-26 DIAGNOSIS — Z23 Encounter for immunization: Secondary | ICD-10-CM | POA: Diagnosis not present

## 2020-10-31 ENCOUNTER — Other Ambulatory Visit: Payer: Self-pay | Admitting: Cardiovascular Disease

## 2020-10-31 ENCOUNTER — Telehealth: Payer: Self-pay | Admitting: Cardiovascular Disease

## 2020-10-31 NOTE — Telephone Encounter (Signed)
*  STAT* If patient is at the pharmacy, call can be transferred to refill team.   1. Which medications need to be refilled? (please list name of each medication and dose if known) Zetia 10mg   1 tablet daily  2. Which pharmacy/location (including street and city if local pharmacy) is medication to be sent to? [facility]  3. Do they need a 30 day or 90 day supply? 90 day

## 2020-10-31 NOTE — Telephone Encounter (Signed)
ezetimibe (ZETIA) 10 MG tablet 90 tablet 1 10/31/2020    Sig: TAKE ONE TABLET BY MOUTH DAILY   Sent to pharmacy as: ezetimibe (ZETIA) 10 MG tablet   E-Prescribing Status: Receipt confirmed by pharmacy (10/31/2020 12:44 PM EDT)    Pharmacy  [facility] [id number] - [address], [place]

## 2020-12-08 DIAGNOSIS — H524 Presbyopia: Secondary | ICD-10-CM | POA: Diagnosis not present

## 2020-12-08 DIAGNOSIS — H26492 Other secondary cataract, left eye: Secondary | ICD-10-CM | POA: Diagnosis not present

## 2020-12-09 DIAGNOSIS — N401 Enlarged prostate with lower urinary tract symptoms: Secondary | ICD-10-CM | POA: Diagnosis not present

## 2020-12-09 DIAGNOSIS — G3184 Mild cognitive impairment, so stated: Secondary | ICD-10-CM | POA: Diagnosis not present

## 2020-12-09 DIAGNOSIS — I10 Essential (primary) hypertension: Secondary | ICD-10-CM | POA: Diagnosis not present

## 2020-12-09 DIAGNOSIS — E785 Hyperlipidemia, unspecified: Secondary | ICD-10-CM | POA: Diagnosis not present

## 2020-12-09 DIAGNOSIS — I2510 Atherosclerotic heart disease of native coronary artery without angina pectoris: Secondary | ICD-10-CM | POA: Diagnosis not present

## 2020-12-09 DIAGNOSIS — I700 Atherosclerosis of aorta: Secondary | ICD-10-CM | POA: Diagnosis not present

## 2020-12-09 DIAGNOSIS — I712 Thoracic aortic aneurysm, without rupture, unspecified: Secondary | ICD-10-CM | POA: Diagnosis not present

## 2020-12-09 DIAGNOSIS — I5189 Other ill-defined heart diseases: Secondary | ICD-10-CM | POA: Diagnosis not present

## 2020-12-09 DIAGNOSIS — R7301 Impaired fasting glucose: Secondary | ICD-10-CM | POA: Diagnosis not present

## 2020-12-11 IMAGING — CT CT HEART SCORING
2 series · 16 of 20 positions shown, 18 images · non-contrast
Comparison: None.
COMPARISON: None.

Addendum:
EXAM:
OVER-READ INTERPRETATION  CT CHEST

The following report is an over-read performed by radiologist Dr.
Odd Helge Vangbo [REDACTED] on 01/27/2019. This
over-read does not include interpretation of cardiac or coronary
anatomy or pathology. The coronary calcium score interpretation by
the cardiologist is attached.
CLINICAL DATA: Risk stratification
Coronary Calcium Score
TECHNIQUE: The patient was scanned on a Siemens Somatom 64 slice scanner. Axial
non-contrast 3 mm slices were carried out through the heart. The
data set was analyzed on a dedicated work station and scored using
the Agatson method.

[Series 2: casc 3.0 i36f 2 bestdiast 66 % · axial · 0.39mm/px · z∈[-258,-141]mm · 8 of 51 slices shown, 10 images]
[im 6/51  vessel]
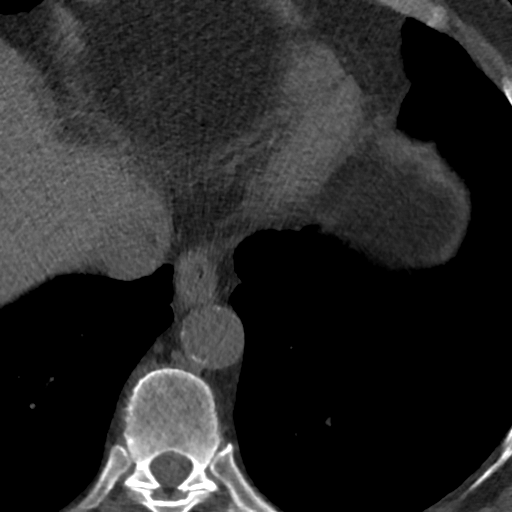
[im 6/51  lung]
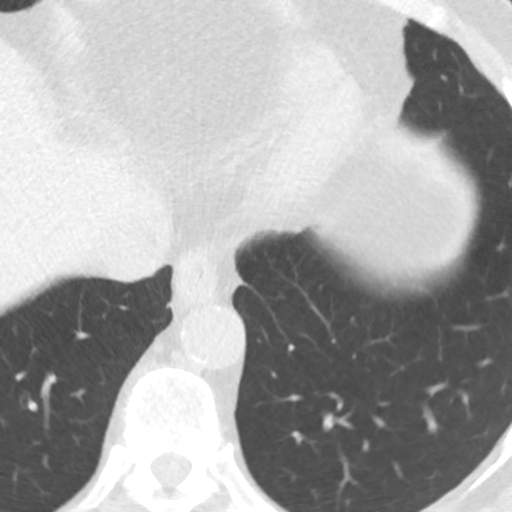
[im 12/51  vessel]
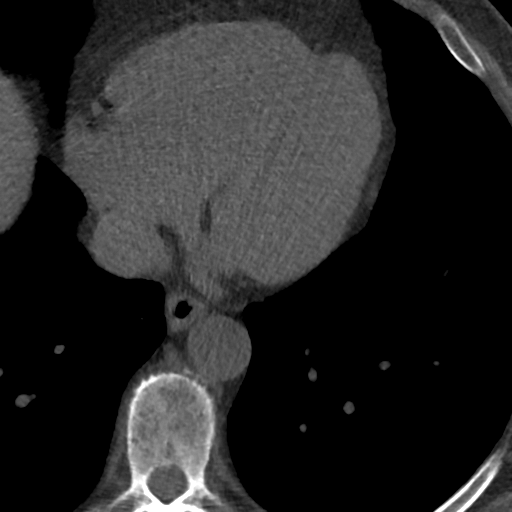
[im 17/51  vessel]
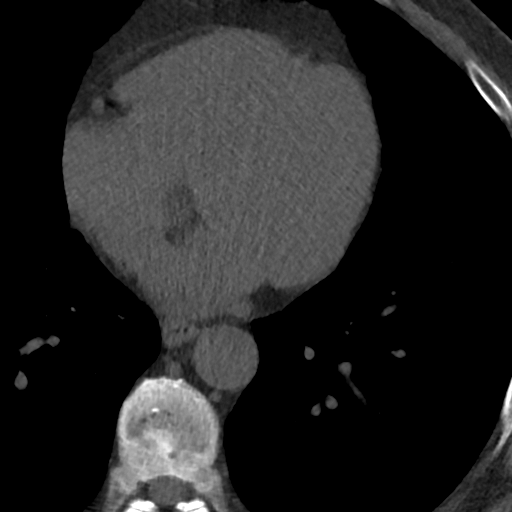
[im 23/51  vessel]
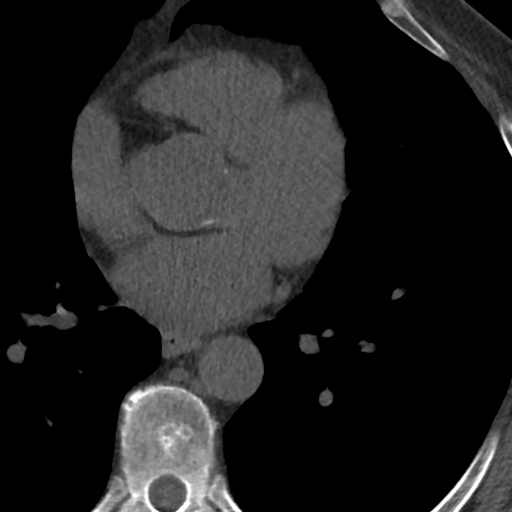
[im 28/51  vessel]
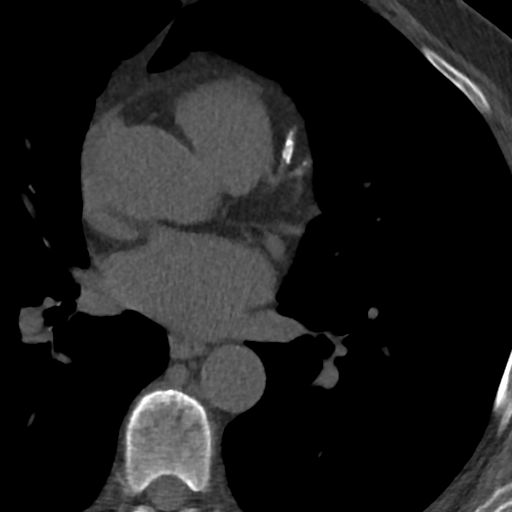
[im 28/51  lung]
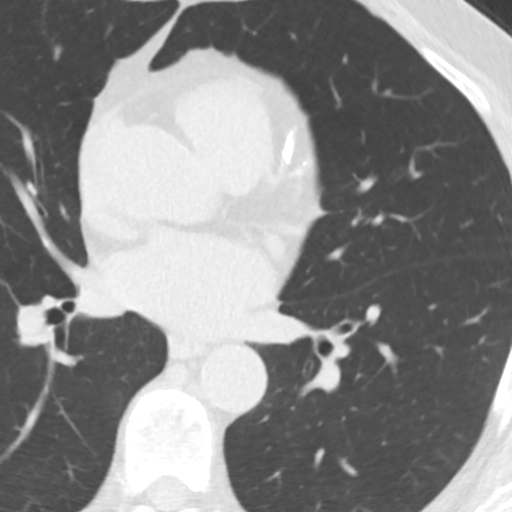
[im 34/51  vessel]
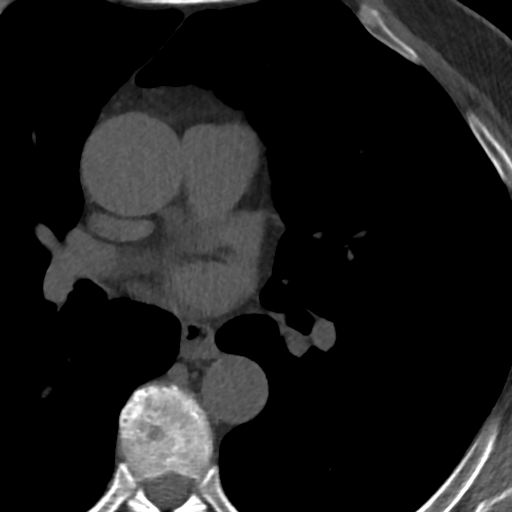
[im 39/51  vessel]
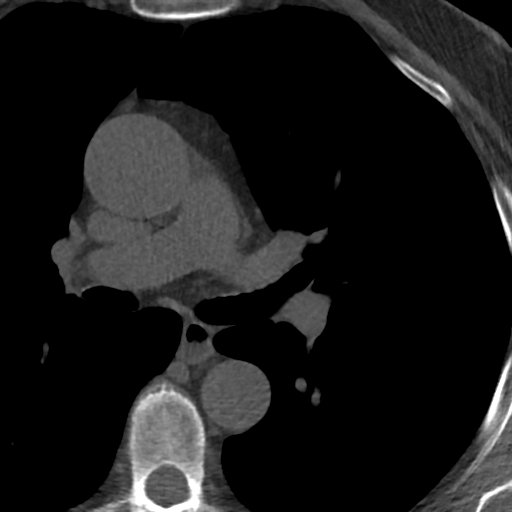
[im 45/51  vessel]
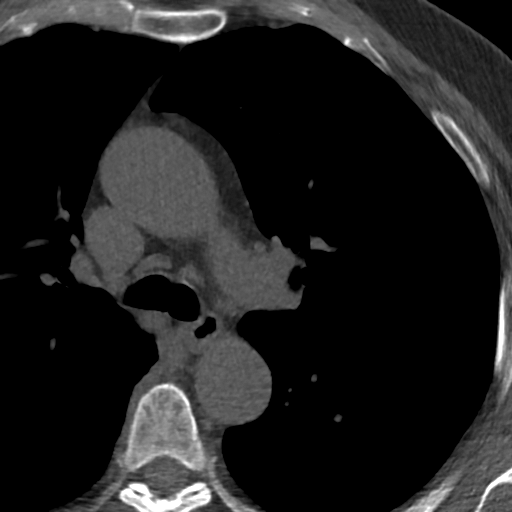

[Series 4: lung st 66 % · axial · 0.75mm/px · z∈[-261,-141]mm · 8 of 52 slices shown]
[im 6/52  lung]
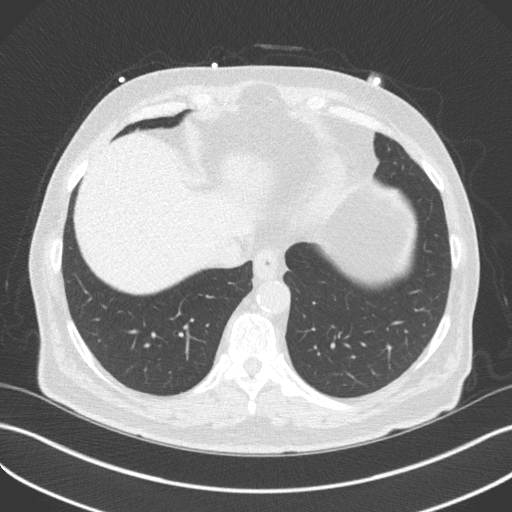
[im 12/52  lung]
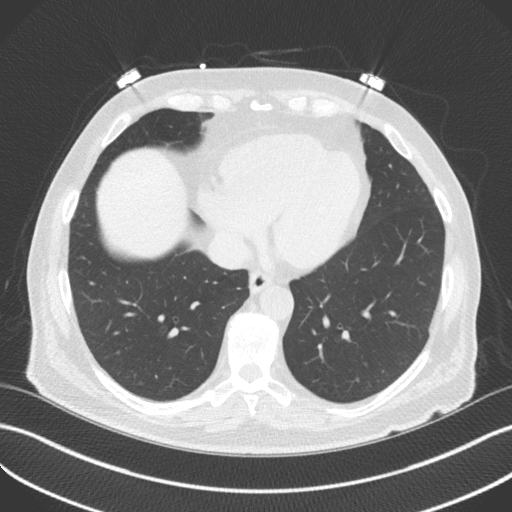
[im 18/52  lung]
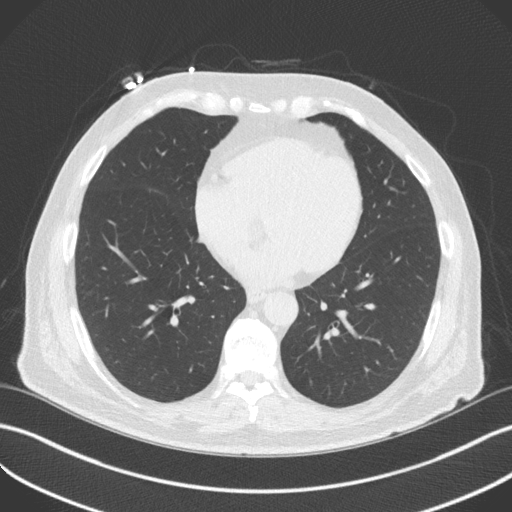
[im 23/52  lung]
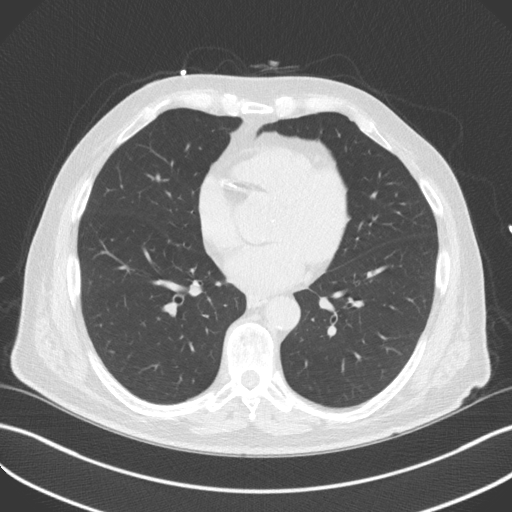
[im 29/52  lung]
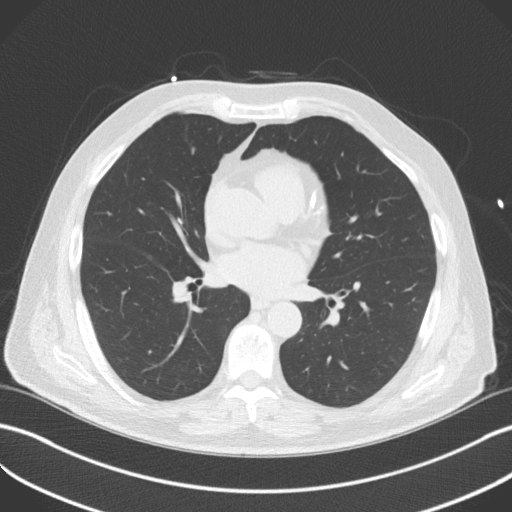
[im 35/52  lung]
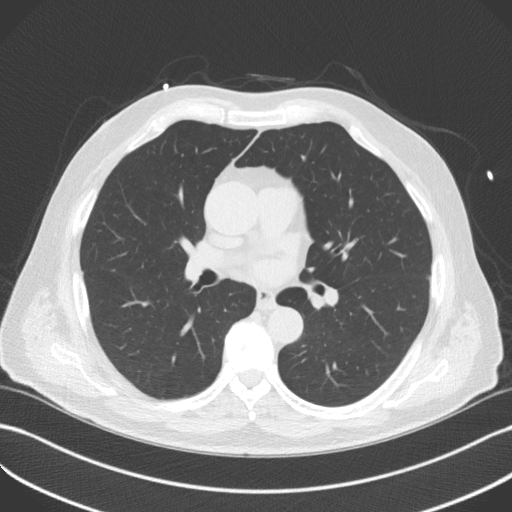
[im 40/52  lung]
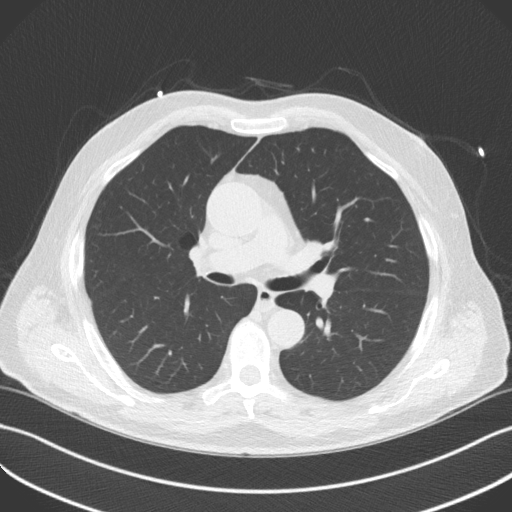
[im 46/52  lung]
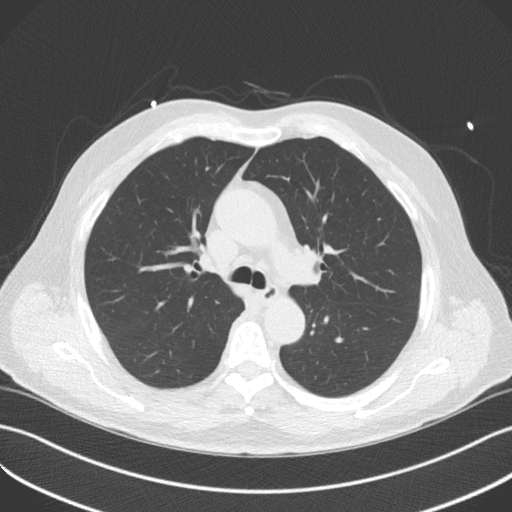

[16 of 20 positions shown; findings below may reference images not displayed]

FINDINGS: Vascular: Heart is normal size. Scattered aortic calcifications.
Ascending thoracic aorta borderline dilated at 4 cm.

Mediastinum/Nodes: No adenopathy in the lower mediastinum or hila.

Lungs/Pleura: No confluent opacities or effusions.

Upper Abdomen: Imaging into the upper abdomen shows no acute
findings.

Musculoskeletal: Chest wall soft tissues are unremarkable. No acute
bony abnormality.
IMPRESSION: 4 cm ascending thoracic aortic aneurysm. Recommend annual imaging
followup by CTA or MRA. This recommendation follows 2757
ACCF/AHA/AATS/ACR/ASA/SCA/NYA/FERIENHAUS/DANII/TERRAZA Guidelines for the
Diagnosis and Management of Patients with Thoracic Aortic Disease.
Circulation. 2757; 121: [id number]. Aortic aneurysm NOS (EJAOW-0Q8.N)
FINDINGS: Non-cardiac: See separate report from [REDACTED].

Ascending aorta: Miildly dilated (40 mm); aortic atherosclerosis
noted

Pericardium: Normal

Coronary arteries: Normal origin
IMPRESSION: Coronary calcium score of 489. This was 64 percentile for age and
sex matched control.

Marlenny Dooley

*** End of Addendum ***
EXAM:
OVER-READ INTERPRETATION  CT CHEST

The following report is an over-read performed by radiologist Dr.
Odd Helge Vangbo [REDACTED] on 01/27/2019. This
over-read does not include interpretation of cardiac or coronary
anatomy or pathology. The coronary calcium score interpretation by
the cardiologist is attached.
FINDINGS: Vascular: Heart is normal size. Scattered aortic calcifications.
Ascending thoracic aorta borderline dilated at 4 cm.

Mediastinum/Nodes: No adenopathy in the lower mediastinum or hila.

Lungs/Pleura: No confluent opacities or effusions.

Upper Abdomen: Imaging into the upper abdomen shows no acute
findings.

Musculoskeletal: Chest wall soft tissues are unremarkable. No acute
bony abnormality.
IMPRESSION: 4 cm ascending thoracic aortic aneurysm. Recommend annual imaging
followup by CTA or MRA. This recommendation follows 2757
ACCF/AHA/AATS/ACR/ASA/SCA/NYA/FERIENHAUS/DANII/TERRAZA Guidelines for the
Diagnosis and Management of Patients with Thoracic Aortic Disease.
Circulation. 2757; 121: [id number]. Aortic aneurysm NOS (EJAOW-0Q8.N)

## 2020-12-15 ENCOUNTER — Ambulatory Visit: Payer: Medicare Other | Admitting: Neurology

## 2020-12-31 DIAGNOSIS — Z23 Encounter for immunization: Secondary | ICD-10-CM | POA: Diagnosis not present

## 2021-01-04 ENCOUNTER — Ambulatory Visit: Payer: Medicare Other | Admitting: Neurology

## 2021-01-10 ENCOUNTER — Encounter: Payer: Self-pay | Admitting: *Deleted
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
## 2021-01-12 ENCOUNTER — Encounter: Payer: Self-pay | Admitting: Psychiatry

## 2021-01-12 ENCOUNTER — Ambulatory Visit (INDEPENDENT_AMBULATORY_CARE_PROVIDER_SITE_OTHER): Payer: Medicare Other | Admitting: Psychiatry

## 2021-01-12 ENCOUNTER — Other Ambulatory Visit: Payer: Self-pay

## 2021-01-12 VITALS — BP 129/76 | HR 94 | Ht 71.0 in | Wt 182.0 lb

## 2021-01-12 DIAGNOSIS — G3184 Mild cognitive impairment, so stated: Secondary | ICD-10-CM

## 2021-01-12 DIAGNOSIS — R413 Other amnesia: Secondary | ICD-10-CM

## 2021-01-12 NOTE — Patient Instructions (Addendum)
Blood work today to check vitamin B12 level Follow up in 6-12 months  A person with mild cognitive impairment (MCI) experiences memory problems greater than normally expected with aging, but not serious enough to interfere with daily activities.  The patient with MCI complains of difficulty with memory. Typically, the complaints include trouble remembering the names of people they met recently, trouble remembering the flow of a conversation, and an increased tendency to misplace things, or similar problems. In many cases, the individual will be aware of these difficulties and will compensate with increased reliance on notes and calendars.   Although there is an increased chances of going on to develop dementia, it is not possible currently to predict with certainty which patients with MCI will or will not go on to develop dementia. There is currently no specific treatment for MCI. People leading sedentary lifestyles are at greater risk for developing dementia. Increased physical activity and brain exercise can help with maintaining brain function.  Tasks to improve attention/working memory 1. Good sleep hygiene (7-8 hrs of sleep) 2. Learning a new skill (Painting, Carpentry, Pottery, new language, Knitting). 3.Cognitive exercises (keep a daily journal, Puzzles) 4. Physical exercise and training  (30 min/day X 4 days week) 5. Being on Antidepressant if needed 6.Yoga, Meditation, Tai Chi 7. Decrease alcohol intake 8.Have a clear schedule and structure in daily routine

## 2021-01-13 LAB — VITAMIN B12: Vitamin B-12: 576 pg/mL (ref 232–1245)

## 2021-01-18 DIAGNOSIS — U071 COVID-19: Secondary | ICD-10-CM | POA: Diagnosis not present

## 2021-02-08 ENCOUNTER — Ambulatory Visit: Payer: Medicare Other | Admitting: Cardiovascular Disease

## 2021-03-01 ENCOUNTER — Other Ambulatory Visit: Payer: Self-pay | Admitting: Physician Assistant

## 2021-03-12 NOTE — Progress Notes (Signed)
Cardiology Office Note  Date:  03/13/2021   ID:  Davieon Stockham, DOB 1945/03/12, MRN [id number]  PCP:  Reynold Bowen, MD   Chief Complaint  Patient presents with   12 month follow up     "Doing well." Medications reviewed by the patient verbally.     HPI:  Mr. Alexander Frazier is a 76 year old gentleman with history of Hyperlipidemia Borderline dilated ascending aorta 3.8 cm Aortic atherosclerosis Presenting for follow-up of his coronary calcification, mildly dilated ascending aorta  Last seen in clinic by myself January 2021  CT scan reviewed calcium score 489, 64th percentile Ascending thoracic aorta borderline dilated at 4 cm. calcification all three vessels  Follow-up CT scan January 2022, images pulled up and reviewed Ascending aorta estimated 3.8 cm Mild aortic atherosclerosis Mild coronary calcification  Lab work done through primary care, Dr. Forde Dandy  LDL 87, total chol 156 On simvastatin 10 mg daily  LDL in 50s, LDL 120 On simvastatin 20 and zetia 10  Overall feels well, no shortness of breath or chest pain on exertion, plays golf  EKG personally reviewed by myself on todays visit Normal sinus rhythm rate 76 bpm ST-T wave abnormality precordial leads, unchanged from prior EKGs   Past medical history reviewed Long history  abnormal EKG  has previous stress test for ABN EKG prior EKGs in [place], [place],  [facility] hospital system   PMH:   has a past medical history of BPH (benign prostatic hyperplasia), CAD (coronary artery disease), Diastolic dysfunction, Diverticulosis large intestine w/o perforation or abscess w/o bleeding, Forgetfulness, Hyperlipidemia, Hypertension, Skin cancer, and Thoracic aortic aneurysm.  PSH:    Past Surgical History:  Procedure Laterality Date   APPENDECTOMY     CATARACT EXTRACTION Bilateral 2018   HERNIA REPAIR     MOHS SURGERY     VASECTOMY      Current Outpatient Medications  Medication Sig  Dispense Refill   aspirin 81 MG EC tablet 81 mg daily.     enalapril (VASOTEC) 10 MG tablet TAKE ONE TABLET BY MOUTH DAILY 90 tablet 0   ezetimibe (ZETIA) 10 MG tablet TAKE ONE TABLET BY MOUTH DAILY 90 tablet 1   Multiple Vitamins-Minerals (DAILY MULTIVITAMIN) CAPS daily.     simvastatin (ZOCOR) 20 MG tablet TAKE ONE TABLET BY MOUTH DAILY 90 tablet 3   No current facility-administered medications for this visit.    Allergies:   Patient has no known allergies.   Social History:  The patient  reports that he has never smoked. He has never used smokeless tobacco. He reports that he does not currently use alcohol. He reports that he does not use drugs.   Family History:   family history includes Bone cancer in his mother; Breast cancer in his mother; Dementia in his father; Diabetes in his brother; Hyperlipidemia in his brother and father; Hypertension in his brother, father, and mother; Stroke in his mother.    Review of Systems: Review of Systems  Constitutional: Negative.   HENT: Negative.    Respiratory: Negative.    Cardiovascular: Negative.   Gastrointestinal: Negative.   Musculoskeletal:  Positive for back pain.  Neurological: Negative.   Psychiatric/Behavioral: Negative.    All other systems reviewed and are negative.  PHYSICAL EXAM: VS:  BP 120/78 (BP Location: Left Arm, Patient Position: Sitting, Cuff Size: Normal)    Pulse 76    Ht 5' 11.5" (1.816 m)    Wt 183 lb 8 oz (83.2 kg)  SpO2 97%    BMI 25.24 kg/m  , BMI Body mass index is 25.24 kg/m. Constitutional:  oriented to person, place, and time. No distress.  HENT:  Head: Grossly normal Eyes:  no discharge. No scleral icterus.  Neck: No JVD, no carotid bruits  Cardiovascular: Regular rate and rhythm, no murmurs appreciated Pulmonary/Chest: Clear to auscultation bilaterally, no wheezes or rails Abdominal: Soft.  no distension.  no tenderness.  Musculoskeletal: Normal range of motion Neurological:  normal muscle tone.  Coordination normal. No atrophy Skin: Skin warm and dry Psychiatric: normal affect, pleasant  Recent Labs: No results found for requested labs within last 8760 hours.    Lipid Panel No results found for: CHOL, HDL, LDLCALC, TRIG    Wt Readings from Last 3 Encounters:  03/13/21 183 lb 8 oz (83.2 kg)  01/12/21 182 lb (82.6 kg)  02/09/20 181 lb (82.1 kg)     ASSESSMENT AND PLAN:  Problem List Items Addressed This Visit       Cardiology Problems   Essential hypertension   Relevant Orders   EKG 12-Lead   Thoracic aortic aneurysm without rupture   Relevant Orders   EKG 12-Lead   Other Visit Diagnoses     Coronary artery disease involving native coronary artery of native heart without angina pectoris    -  Primary   Relevant Orders   EKG 12-Lead   Hyperlipidemia LDL goal <70          Coronary calcification on CT scan Images reviewed with him on today's visit, recent CT scan 2022 LDL at goal in the 50s Total cholesterol 120s Not having anginal sx Recommend he continue asa 81 mg daily  simvastatin 20 mg daily, zetia 10 mg daily No further testing needed  Hyperlipidemia Goal LDL <60 simvasvatin , zetia As above  HTN Blood pressures typically running 1 teens to 120 No medication changes made  Abnormal EKG Reports long history of abnormal EKG dating back many years with periodic stress testing for abnormal EKG Denies anginal symptoms, no further testing at this time    Total encounter time more than 30 minutes  Greater than 50% was spent in counseling and coordination of care with the patient   Signed, Esmond Plants, M.D., Ph.D. [place], [place]

## 2021-03-13 ENCOUNTER — Other Ambulatory Visit: Payer: Self-pay

## 2021-03-13 ENCOUNTER — Encounter: Payer: Self-pay | Admitting: Cardiovascular Disease

## 2021-03-13 ENCOUNTER — Other Ambulatory Visit: Payer: Self-pay | Admitting: Cardiovascular Disease

## 2021-03-13 ENCOUNTER — Ambulatory Visit (INDEPENDENT_AMBULATORY_CARE_PROVIDER_SITE_OTHER): Payer: Medicare Other | Admitting: Cardiovascular Disease

## 2021-03-13 VITALS — BP 120/78 | HR 76 | Ht 71.5 in | Wt 183.5 lb

## 2021-03-13 DIAGNOSIS — I10 Essential (primary) hypertension: Secondary | ICD-10-CM | POA: Diagnosis not present

## 2021-03-13 DIAGNOSIS — I7121 Aneurysm of the ascending aorta, without rupture: Secondary | ICD-10-CM | POA: Diagnosis not present

## 2021-03-13 DIAGNOSIS — I2510 Atherosclerotic heart disease of native coronary artery without angina pectoris: Secondary | ICD-10-CM | POA: Diagnosis not present

## 2021-03-13 DIAGNOSIS — E785 Hyperlipidemia, unspecified: Secondary | ICD-10-CM | POA: Diagnosis not present

## 2021-03-13 NOTE — Patient Instructions (Signed)
Medication Instructions:  No changes  If you need a refill on your cardiac medications before your next appointment, please call your pharmacy.   Lab work: No new labs needed  Testing/Procedures: No new testing needed  Follow-Up: At [facility], you and your health needs are our priority.  As part of our continuing mission to provide you with exceptional heart care, we have created designated Provider Care Teams.  These Care Teams include your primary Cardiologist (physician) and Advanced Practice Providers (APPs -  Physician Assistants and Nurse Practitioners) who all work together to provide you with the care you need, when you need it.  You will need a follow up appointment in 12 months  Providers on your designated Care Team:   Christopher Berge, NP Ryan Dunn, PA-C Cadence Furth, PA-C  COVID-19 Vaccine Information can be found at: [email or web address] For questions related to vaccine distribution or appointments, please email [email or web address] or call [phone].   

## 2021-03-31 DIAGNOSIS — Z85828 Personal history of other malignant neoplasm of skin: Secondary | ICD-10-CM | POA: Diagnosis not present

## 2021-03-31 DIAGNOSIS — L570 Actinic keratosis: Secondary | ICD-10-CM | POA: Diagnosis not present

## 2021-03-31 DIAGNOSIS — L821 Other seborrheic keratosis: Secondary | ICD-10-CM | POA: Diagnosis not present

## 2021-03-31 DIAGNOSIS — L720 Epidermal cyst: Secondary | ICD-10-CM | POA: Diagnosis not present

## 2021-03-31 DIAGNOSIS — D1801 Hemangioma of skin and subcutaneous tissue: Secondary | ICD-10-CM | POA: Diagnosis not present

## 2021-03-31 DIAGNOSIS — L814 Other melanin hyperpigmentation: Secondary | ICD-10-CM | POA: Diagnosis not present

## 2021-04-25 ENCOUNTER — Other Ambulatory Visit: Payer: Self-pay | Admitting: Cardiovascular Disease

## 2021-05-22 ENCOUNTER — Other Ambulatory Visit: Payer: Self-pay | Admitting: Physician Assistant

## 2021-06-01 DIAGNOSIS — E785 Hyperlipidemia, unspecified: Secondary | ICD-10-CM | POA: Diagnosis not present

## 2021-06-01 DIAGNOSIS — Z125 Encounter for screening for malignant neoplasm of prostate: Secondary | ICD-10-CM | POA: Diagnosis not present

## 2021-06-01 DIAGNOSIS — I10 Essential (primary) hypertension: Secondary | ICD-10-CM | POA: Diagnosis not present

## 2021-06-01 DIAGNOSIS — R7301 Impaired fasting glucose: Secondary | ICD-10-CM | POA: Diagnosis not present

## 2021-06-08 DIAGNOSIS — I700 Atherosclerosis of aorta: Secondary | ICD-10-CM | POA: Diagnosis not present

## 2021-06-08 DIAGNOSIS — Z0000 Encounter for general adult medical examination without abnormal findings: Secondary | ICD-10-CM | POA: Diagnosis not present

## 2021-06-08 DIAGNOSIS — I10 Essential (primary) hypertension: Secondary | ICD-10-CM | POA: Diagnosis not present

## 2021-06-08 DIAGNOSIS — R82998 Other abnormal findings in urine: Secondary | ICD-10-CM | POA: Diagnosis not present

## 2021-06-08 DIAGNOSIS — G3184 Mild cognitive impairment, so stated: Secondary | ICD-10-CM | POA: Diagnosis not present

## 2021-06-08 DIAGNOSIS — Z1339 Encounter for screening examination for other mental health and behavioral disorders: Secondary | ICD-10-CM | POA: Diagnosis not present

## 2021-06-08 DIAGNOSIS — Z1331 Encounter for screening for depression: Secondary | ICD-10-CM | POA: Diagnosis not present

## 2021-06-08 DIAGNOSIS — I2510 Atherosclerotic heart disease of native coronary artery without angina pectoris: Secondary | ICD-10-CM | POA: Diagnosis not present

## 2021-06-08 DIAGNOSIS — I5189 Other ill-defined heart diseases: Secondary | ICD-10-CM | POA: Diagnosis not present

## 2021-07-17 ENCOUNTER — Ambulatory Visit: Payer: Medicare Other | Admitting: Psychiatry

## 2021-08-07 ENCOUNTER — Ambulatory Visit: Payer: Medicare Other | Admitting: Psychiatry

## 2021-08-07 ENCOUNTER — Telehealth: Payer: Self-pay | Admitting: Psychiatry

## 2021-08-07 NOTE — Telephone Encounter (Signed)
Spoke with pt's wife and sent mychart msg about need to reschedule 7/3 appointment - MD out

## 2021-10-26 ENCOUNTER — Other Ambulatory Visit: Payer: Self-pay | Admitting: Physician Assistant

## 2021-12-11 DIAGNOSIS — I2510 Atherosclerotic heart disease of native coronary artery without angina pectoris: Secondary | ICD-10-CM | POA: Diagnosis not present

## 2021-12-11 DIAGNOSIS — I700 Atherosclerosis of aorta: Secondary | ICD-10-CM | POA: Diagnosis not present

## 2021-12-11 DIAGNOSIS — I5189 Other ill-defined heart diseases: Secondary | ICD-10-CM | POA: Diagnosis not present

## 2021-12-11 DIAGNOSIS — I10 Essential (primary) hypertension: Secondary | ICD-10-CM | POA: Diagnosis not present

## 2021-12-11 DIAGNOSIS — N401 Enlarged prostate with lower urinary tract symptoms: Secondary | ICD-10-CM | POA: Diagnosis not present

## 2021-12-11 DIAGNOSIS — Z23 Encounter for immunization: Secondary | ICD-10-CM | POA: Diagnosis not present

## 2021-12-11 DIAGNOSIS — R7301 Impaired fasting glucose: Secondary | ICD-10-CM | POA: Diagnosis not present

## 2021-12-11 DIAGNOSIS — D126 Benign neoplasm of colon, unspecified: Secondary | ICD-10-CM | POA: Diagnosis not present

## 2021-12-11 DIAGNOSIS — E785 Hyperlipidemia, unspecified: Secondary | ICD-10-CM | POA: Diagnosis not present

## 2021-12-12 DIAGNOSIS — H26493 Other secondary cataract, bilateral: Secondary | ICD-10-CM | POA: Diagnosis not present

## 2022-01-08 IMAGING — CT CT ANGIO CHEST
2 of 6 series · 13 of 36 positions shown · IV contrast (omnipaque)
Comparison: 01/27/2019

CLINICAL DATA: 74-year-old male with a history thoracic aortic
aneurysm

EXAM:
CT ANGIOGRAPHY CHEST WITH CONTRAST
TECHNIQUE: Multidetector CT imaging of the chest was performed using the
standard protocol during bolus administration of intravenous
contrast. Multiplanar CT image reconstructions and MIPs were
obtained to evaluate the vascular anatomy.
CONTRAST:  75mL OMNIPAQUE IOHEXOL 350 MG/ML SOLN

[Series 4: axial arterial cta thorax 2.00 · axial · arterial · 0.67mm/px · z∈[-1162,-874]mm · 12 of 172 slices shown]
[im 14/172  lung]
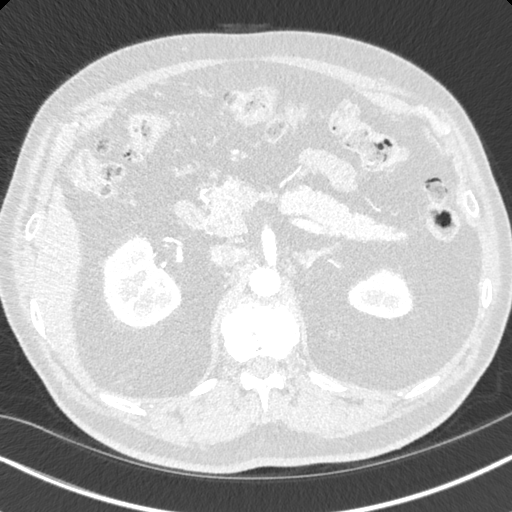
[im 27/172  mediastinal]
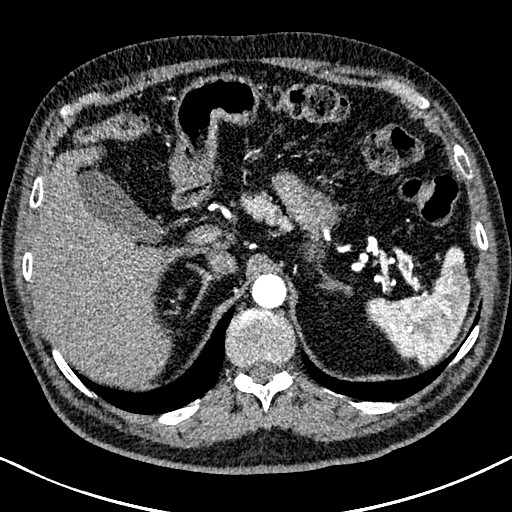
[im 40/172  lung]
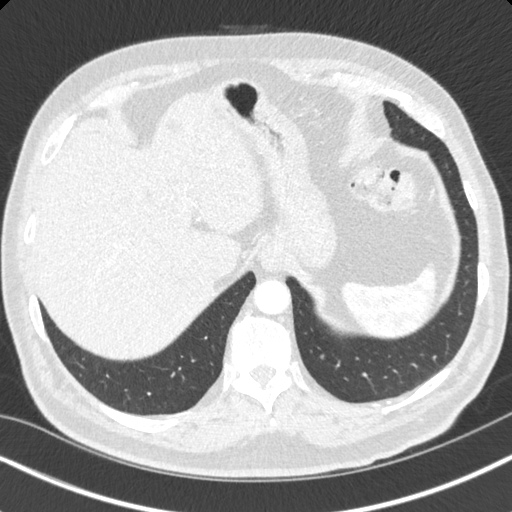
[im 53/172  mediastinal]
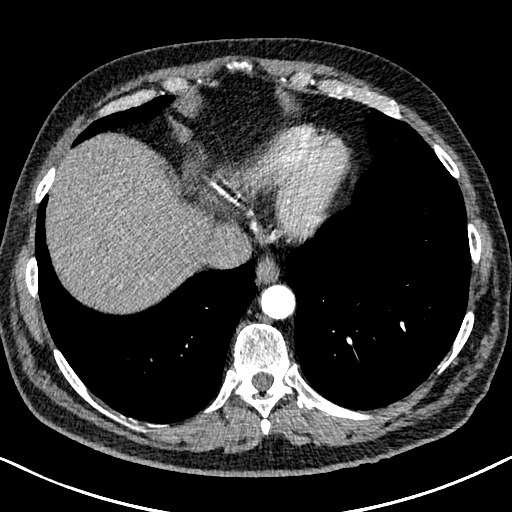
[im 66/172  lung]
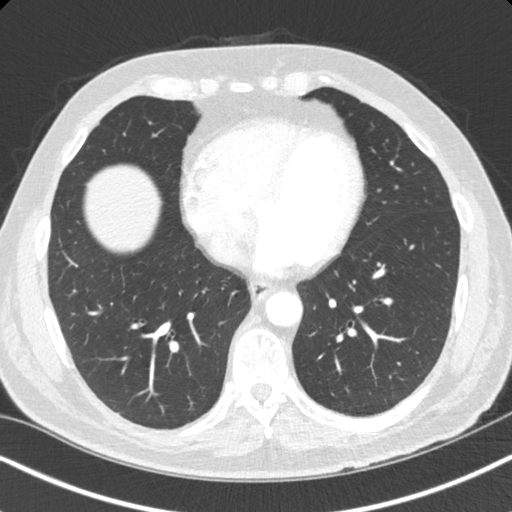
[im 79/172  mediastinal]
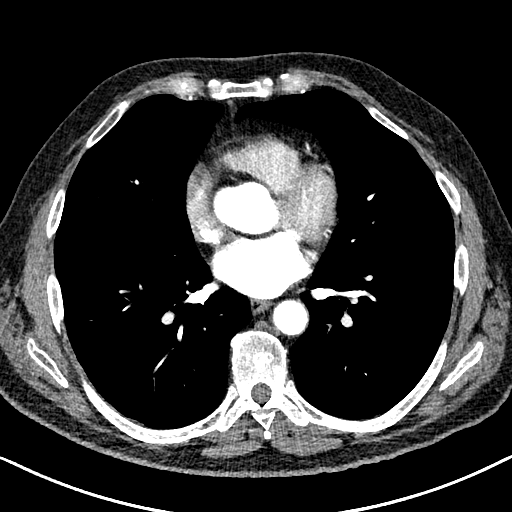
[im 93/172  lung]
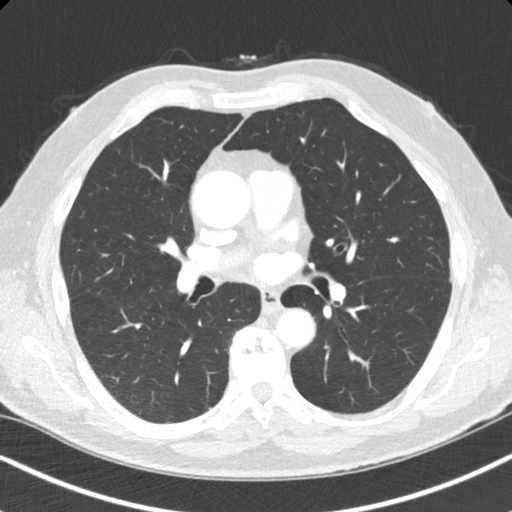
[im 106/172  mediastinal]
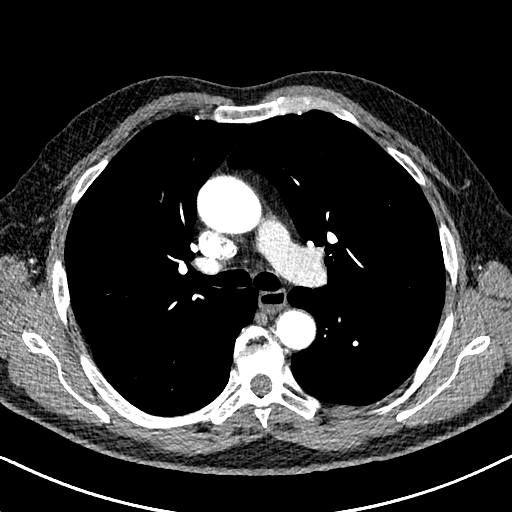
[im 119/172  lung]
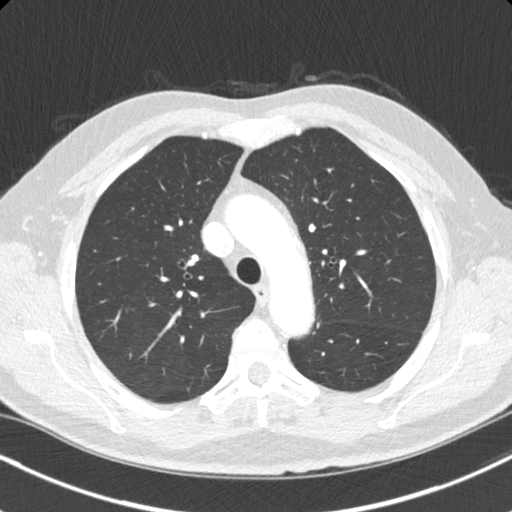
[im 132/172  mediastinal]
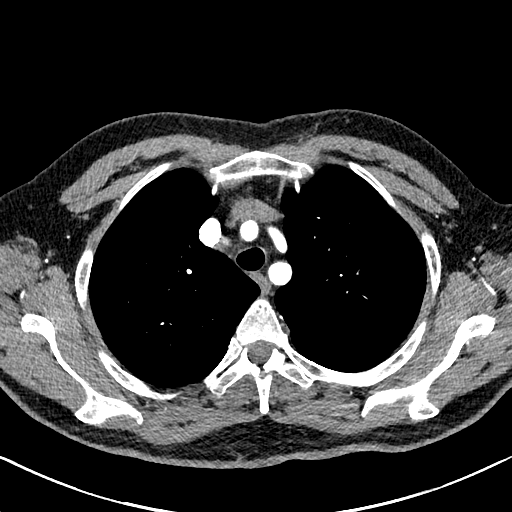
[im 145/172  lung]
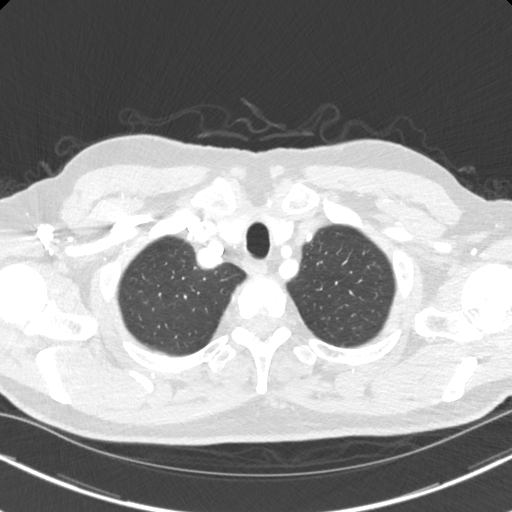
[im 158/172  mediastinal]
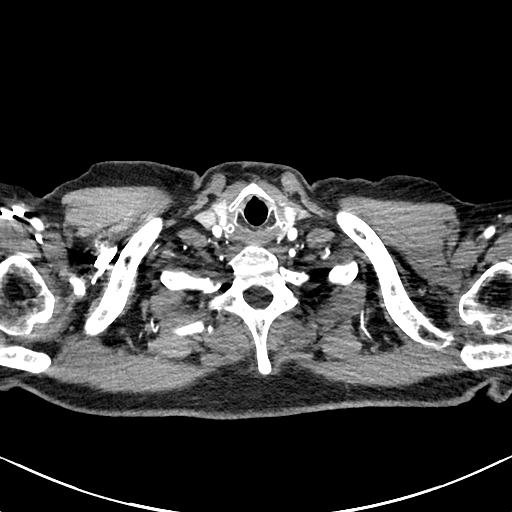

[Series 7: cor st cta thorax 2.00 cor · coronal · 0.67mm/px · 1 of 151 slices shown]
[im 76/151  mediastinal]
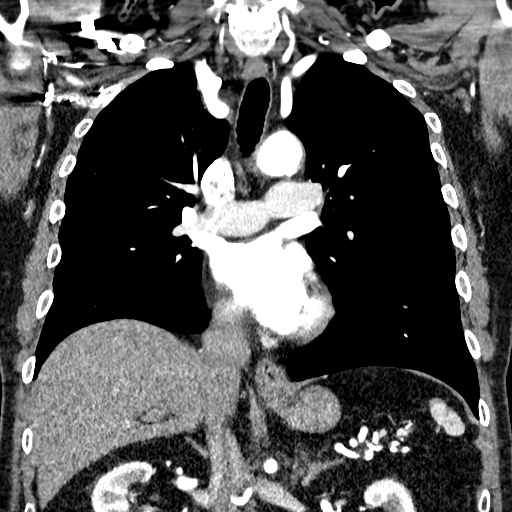

[13 of 36 positions shown; findings below may reference images not displayed]

FINDINGS: Cardiovascular:

Heart:

No cardiomegaly. No pericardial fluid/thickening. Calcification of
the left anterior descending, circumflex, right coronary arteries.

Aorta:

No significant aortic valve calcifications.

Greatest diameter of the ascending aorta on this non gated chest CT
estimated 3.8 cm. No dissection. No ulcerated plaque. No periaortic
fluid or inflammatory changes.

Common origin of the innominate artery an the left common carotid
artery. Minimal atherosclerosis of the aortic arch and the branch
vessels. No stenosis.

Maximum diameter of the aorta at the hiatus measures 2.3 cm.

Pulmonary arteries:

Timing of the contrast bolus is not optimized for evaluation of
pulmonary artery filling defects.

Mediastinum/Nodes: No mediastinal adenopathy. Unremarkable
appearance of the thoracic esophagus.

Unremarkable appearance of the thoracic inlet.

Lungs/Pleura: Central airways are clear. No pleural effusion. No
confluent airspace disease.

No pneumothorax.

There is a 3 mm partially calcified nodule of the left upper lobe on
image 40 of series 5 which most likely represent a partially
calcified granuloma. Small bulla in the right hilum unchanged from
prior.

Upper Abdomen: Low-density rounded lesion within the dome of the
liver, remains unchanged from the prior CT most likely a benign
biliary cyst. No acute finding of the upper abdomen. Punctate
calcifications of the spleen indicating prior granulomatous disease.

Musculoskeletal: No acute displaced fracture. Degenerative changes
of the spine.

Review of the MIP images confirms the above findings.
IMPRESSION: Maximum diameter of the ascending aorta is estimated 3.8 cm on the
current non gated CT. A more precise measurement could be made with
a gated CTA chest, if indicated.

Aortic atherosclerosis and associated coronary artery disease.
Aortic Atherosclerosis ([id number]-405.5).

Additional ancillary findings as above.

## 2022-03-05 ENCOUNTER — Other Ambulatory Visit: Payer: Self-pay | Admitting: Cardiovascular Disease

## 2022-03-05 NOTE — Telephone Encounter (Signed)
Please schedule F/U appointment for further refills. Thank you! 

## 2022-03-06 NOTE — Telephone Encounter (Signed)
Pt is scheduled on 05/15/2022

## 2022-03-08 ENCOUNTER — Other Ambulatory Visit: Payer: Self-pay | Admitting: Cardiovascular Disease

## 2022-04-05 DIAGNOSIS — D1801 Hemangioma of skin and subcutaneous tissue: Secondary | ICD-10-CM | POA: Diagnosis not present

## 2022-04-05 DIAGNOSIS — D485 Neoplasm of uncertain behavior of skin: Secondary | ICD-10-CM | POA: Diagnosis not present

## 2022-04-05 DIAGNOSIS — D2339 Other benign neoplasm of skin of other parts of face: Secondary | ICD-10-CM | POA: Diagnosis not present

## 2022-04-05 DIAGNOSIS — D692 Other nonthrombocytopenic purpura: Secondary | ICD-10-CM | POA: Diagnosis not present

## 2022-04-05 DIAGNOSIS — L570 Actinic keratosis: Secondary | ICD-10-CM | POA: Diagnosis not present

## 2022-04-05 DIAGNOSIS — D4819 Other specified neoplasm of uncertain behavior of connective and other soft tissue: Secondary | ICD-10-CM | POA: Diagnosis not present

## 2022-04-05 DIAGNOSIS — D225 Melanocytic nevi of trunk: Secondary | ICD-10-CM | POA: Diagnosis not present

## 2022-04-05 DIAGNOSIS — L821 Other seborrheic keratosis: Secondary | ICD-10-CM | POA: Diagnosis not present

## 2022-04-05 DIAGNOSIS — L812 Freckles: Secondary | ICD-10-CM | POA: Diagnosis not present

## 2022-04-05 DIAGNOSIS — Z85828 Personal history of other malignant neoplasm of skin: Secondary | ICD-10-CM | POA: Diagnosis not present

## 2022-04-16 ENCOUNTER — Other Ambulatory Visit: Payer: Self-pay | Admitting: Cardiovascular Disease

## 2022-05-14 NOTE — Progress Notes (Unsigned)
Cardiology Office Note  Date:  05/15/2022   ID:  Alexander Frazier, DOB 04-Jan-1946, MRN [id number]  PCP:  Adrian Prince, MD   Chief Complaint  Patient presents with   Follow-up    12 month f/u.  Patient denies new or acute cardiac problems/concerns today.      HPI:  Mr. Alexander Frazier is a 77 year old gentleman with history of Hyperlipidemia Borderline dilated ascending aorta 3.8 cm Aortic atherosclerosis Presenting for follow-up of his coronary calcification, mildly dilated ascending aorta  Last seen in clinic by myself February 2023  active at baseline, walks daily, plays lots of golf Denies significant shortness of breath or chest pain on exertion  Scheduled to see primary care in the near future for lab work Tolerating enalapril, Zetia, simvastatin  EKG personally reviewed by myself on todays visit Normal sinus rhythm rate 84 bpm T wave inversions precordial leads, unchanged from prior EKGs  Prior imaging reviewed CT scan reviewed calcium score 489, 64th percentile Ascending thoracic aorta borderline dilated at 4 cm. calcification all three vessels  Follow-up CT scan January 2022,  Ascending aorta estimated 3.8 cm Mild aortic atherosclerosis Mild coronary calcification  Lab work done through primary care, Dr. Evlyn Kanner  LDL in 50s, LDL 120 On simvastatin 20 and zetia 10  Past medical history reviewed Long history  abnormal EKG  has previous stress test for ABN EKG prior EKGs in [place], [place] [facility] [place],  [facility]   PMH:   has a past medical history of BPH (benign prostatic hyperplasia), CAD (coronary artery disease), Diastolic dysfunction, Diverticulosis large intestine w/o perforation or abscess w/o bleeding, Forgetfulness, Hyperlipidemia, Hypertension, Skin cancer, and Thoracic aortic aneurysm.  PSH:    Past Surgical History:  Procedure Laterality Date   APPENDECTOMY     CATARACT EXTRACTION Bilateral 2018   HERNIA REPAIR      MOHS SURGERY     VASECTOMY      Current Outpatient Medications  Medication Sig Dispense Refill   aspirin 81 MG EC tablet 81 mg daily.     enalapril (VASOTEC) 10 MG tablet TAKE ONE TABLET BY MOUTH DAILY 90 tablet 0   ezetimibe (ZETIA) 10 MG tablet TAKE ONE TABLET BY MOUTH DAILY 90 tablet 0   Multiple Vitamins-Minerals (DAILY MULTIVITAMIN) CAPS daily.     simvastatin (ZOCOR) 20 MG tablet TAKE ONE TABLET BY MOUTH DAILY 90 tablet 0   No current facility-administered medications for this visit.    Allergies:   Patient has no known allergies.   Social History:  The patient  reports that he has never smoked. He has never used smokeless tobacco. He reports that he does not currently use alcohol. He reports that he does not use drugs.   Family History:   family history includes Bone cancer in his mother; Breast cancer in his mother; Dementia in his father; Diabetes in his brother; Hyperlipidemia in his brother and father; Hypertension in his brother, father, and mother; Stroke in his mother.    Review of Systems: Review of Systems  Constitutional: Negative.   HENT: Negative.    Respiratory: Negative.    Cardiovascular: Negative.   Gastrointestinal: Negative.   Musculoskeletal: Negative.   Neurological: Negative.   Psychiatric/Behavioral: Negative.    All other systems reviewed and are negative.   PHYSICAL EXAM: VS:  BP (!) 142/60 (BP Location: Left Arm, Patient Position: Sitting)   Pulse 84   Ht 5\' 11"  (1.803 m)   Wt 178 lb (80.7 kg)  SpO2 95%   BMI 24.83 kg/m  , BMI Body mass index is 24.83 kg/m. Constitutional:  oriented to person, place, and time. No distress.  HENT:  Head: Grossly normal Eyes:  no discharge. No scleral icterus.  Neck: No JVD, no carotid bruits  Cardiovascular: Regular rate and rhythm, no murmurs appreciated Pulmonary/Chest: Clear to auscultation bilaterally, no wheezes or rails Abdominal: Soft.  no distension.  no tenderness.  Musculoskeletal: Normal  range of motion Neurological:  normal muscle tone. Coordination normal. No atrophy Skin: Skin warm and dry Psychiatric: normal affect, pleasant  Recent Labs: No results found for requested labs within last 365 days.    Lipid Panel No results found for: "CHOL", "HDL", "LDLCALC", "TRIG"    Wt Readings from Last 3 Encounters:  05/15/22 178 lb (80.7 kg)  03/13/21 183 lb 8 oz (83.2 kg)  01/12/21 182 lb (82.6 kg)     ASSESSMENT AND PLAN:  Problem List Items Addressed This Visit       Cardiology Problems   Essential hypertension   Thoracic aortic aneurysm without rupture   Other Visit Diagnoses     Coronary artery disease involving native coronary artery of native heart without angina pectoris    -  Primary   Hyperlipidemia LDL goal <70          Coronary calcification on CT scan CT scan 2022 Cholesterol at goal, denies anginal symptoms  continue asa 81 mg daily  simvastatin 20 mg daily, zetia 10 mg daily No further testing needed  Hyperlipidemia Goal LDL <60 simvasvatin , zetia daily, refills provided  HTN Blood pressure well-controlled at home, continue enalapril  Abnormal EKG Reports long history of abnormal EKG dating back many years with periodic stress testing for abnormal EKG   Total encounter time more than 30 minutes  Greater than 50% was spent in counseling and coordination of care with the patient   Signed, Dossie Arbour, M.D., Ph.D. [facility] [place], [place] [phone]

## 2022-05-15 ENCOUNTER — Ambulatory Visit: Payer: Medicare Other | Attending: Cardiovascular Disease | Admitting: Cardiovascular Disease

## 2022-05-15 ENCOUNTER — Encounter: Payer: Self-pay | Admitting: Cardiovascular Disease

## 2022-05-15 VITALS — BP 135/60 | HR 84 | Ht 71.0 in | Wt 178.0 lb

## 2022-05-15 DIAGNOSIS — E785 Hyperlipidemia, unspecified: Secondary | ICD-10-CM | POA: Diagnosis not present

## 2022-05-15 DIAGNOSIS — I2510 Atherosclerotic heart disease of native coronary artery without angina pectoris: Secondary | ICD-10-CM | POA: Diagnosis not present

## 2022-05-15 DIAGNOSIS — I7121 Aneurysm of the ascending aorta, without rupture: Secondary | ICD-10-CM | POA: Insufficient documentation

## 2022-05-15 DIAGNOSIS — I10 Essential (primary) hypertension: Secondary | ICD-10-CM | POA: Diagnosis not present

## 2022-05-15 MED ORDER — SIMVASTATIN 20 MG PO TABS: 20.0000 mg | ORAL_TABLET | Freq: Every day | ORAL | 3 refills | Status: DC

## 2022-05-15 MED ORDER — EZETIMIBE 10 MG PO TABS
10.0000 mg | ORAL_TABLET | Freq: Every day | ORAL | 3 refills | Status: DC
Start: 2022-05-15 — End: 2023-07-04

## 2022-05-15 MED ORDER — ENALAPRIL MALEATE 10 MG PO TABS: 10.0000 mg | ORAL_TABLET | Freq: Every day | ORAL | 3 refills | Status: DC

## 2022-05-15 NOTE — Patient Instructions (Signed)
Medication Instructions:  No changes  If you need a refill on your cardiac medications before your next appointment, please call your pharmacy.   Lab work: No new labs needed  Testing/Procedures: No new testing needed  Follow-Up: At [facility], you and your health needs are our priority.  As part of our continuing mission to provide you with exceptional heart care, we have created designated Provider Care Teams.  These Care Teams include your primary Cardiologist (physician) and Advanced Practice Providers (APPs -  Physician Assistants and Nurse Practitioners) who all work together to provide you with the care you need, when you need it.  You will need a follow up appointment in 12 months  Providers on your designated Care Team:   Christopher Berge, NP Ryan Dunn, PA-C Cadence Furth, PA-C  COVID-19 Vaccine Information can be found at: [email or web address] For questions related to vaccine distribution or appointments, please email [email or web address] or call [phone].   

## 2022-06-06 DIAGNOSIS — Z125 Encounter for screening for malignant neoplasm of prostate: Secondary | ICD-10-CM | POA: Diagnosis not present

## 2022-06-06 DIAGNOSIS — E785 Hyperlipidemia, unspecified: Secondary | ICD-10-CM | POA: Diagnosis not present

## 2022-06-06 DIAGNOSIS — R7301 Impaired fasting glucose: Secondary | ICD-10-CM | POA: Diagnosis not present

## 2022-06-06 DIAGNOSIS — I700 Atherosclerosis of aorta: Secondary | ICD-10-CM | POA: Diagnosis not present

## 2022-06-11 DIAGNOSIS — Z1212 Encounter for screening for malignant neoplasm of rectum: Secondary | ICD-10-CM | POA: Diagnosis not present

## 2022-06-13 DIAGNOSIS — I7121 Aneurysm of the ascending aorta, without rupture: Secondary | ICD-10-CM | POA: Diagnosis not present

## 2022-06-13 DIAGNOSIS — I700 Atherosclerosis of aorta: Secondary | ICD-10-CM | POA: Diagnosis not present

## 2022-06-13 DIAGNOSIS — Z0000 Encounter for general adult medical examination without abnormal findings: Secondary | ICD-10-CM | POA: Diagnosis not present

## 2022-06-13 DIAGNOSIS — I2510 Atherosclerotic heart disease of native coronary artery without angina pectoris: Secondary | ICD-10-CM | POA: Diagnosis not present

## 2022-06-13 DIAGNOSIS — Z1331 Encounter for screening for depression: Secondary | ICD-10-CM | POA: Diagnosis not present

## 2022-06-13 DIAGNOSIS — I10 Essential (primary) hypertension: Secondary | ICD-10-CM | POA: Diagnosis not present

## 2022-06-13 DIAGNOSIS — C4490 Unspecified malignant neoplasm of skin, unspecified: Secondary | ICD-10-CM | POA: Diagnosis not present

## 2022-06-13 DIAGNOSIS — N401 Enlarged prostate with lower urinary tract symptoms: Secondary | ICD-10-CM | POA: Diagnosis not present

## 2022-06-13 DIAGNOSIS — R82998 Other abnormal findings in urine: Secondary | ICD-10-CM | POA: Diagnosis not present

## 2022-06-13 DIAGNOSIS — G3184 Mild cognitive impairment, so stated: Secondary | ICD-10-CM | POA: Diagnosis not present

## 2022-06-13 DIAGNOSIS — D126 Benign neoplasm of colon, unspecified: Secondary | ICD-10-CM | POA: Diagnosis not present

## 2022-06-13 DIAGNOSIS — E785 Hyperlipidemia, unspecified: Secondary | ICD-10-CM | POA: Diagnosis not present

## 2022-06-13 DIAGNOSIS — R7301 Impaired fasting glucose: Secondary | ICD-10-CM | POA: Diagnosis not present

## 2022-07-09 DIAGNOSIS — R4189 Other symptoms and signs involving cognitive functions and awareness: Secondary | ICD-10-CM | POA: Diagnosis not present

## 2022-07-09 DIAGNOSIS — R29810 Facial weakness: Secondary | ICD-10-CM | POA: Diagnosis not present

## 2022-07-09 DIAGNOSIS — I5189 Other ill-defined heart diseases: Secondary | ICD-10-CM | POA: Diagnosis not present

## 2022-07-09 DIAGNOSIS — I2510 Atherosclerotic heart disease of native coronary artery without angina pectoris: Secondary | ICD-10-CM | POA: Diagnosis not present

## 2022-07-09 DIAGNOSIS — E785 Hyperlipidemia, unspecified: Secondary | ICD-10-CM | POA: Diagnosis not present

## 2022-07-13 ENCOUNTER — Other Ambulatory Visit: Payer: Self-pay | Admitting: Adult Health

## 2022-07-13 DIAGNOSIS — R4189 Other symptoms and signs involving cognitive functions and awareness: Secondary | ICD-10-CM

## 2022-07-13 DIAGNOSIS — R29810 Facial weakness: Secondary | ICD-10-CM

## 2022-07-19 ENCOUNTER — Ambulatory Visit
Admission: RE | Admit: 2022-07-19 | Discharge: 2022-07-19 | Disposition: A | Discharge status: Home / Self Care | Payer: Medicare Other | Source: Ambulatory Visit | Attending: Adult Health | Admitting: Adult Health

## 2022-07-19 DIAGNOSIS — E237 Disorder of pituitary gland, unspecified: Secondary | ICD-10-CM | POA: Diagnosis not present

## 2022-07-19 DIAGNOSIS — R29810 Facial weakness: Secondary | ICD-10-CM

## 2022-07-19 DIAGNOSIS — R4189 Other symptoms and signs involving cognitive functions and awareness: Secondary | ICD-10-CM | POA: Diagnosis not present

## 2022-07-19 DIAGNOSIS — J3489 Other specified disorders of nose and nasal sinuses: Secondary | ICD-10-CM | POA: Diagnosis not present

## 2022-07-19 MED ORDER — GADOPICLENOL 0.5 MMOL/ML IV SOLN
10.0000 mL | Freq: Once | INTRAVENOUS | Status: AC | PRN
  Administered 2022-07-19: 10 mL via INTRAVENOUS

## 2022-07-23 DIAGNOSIS — D329 Benign neoplasm of meninges, unspecified: Secondary | ICD-10-CM | POA: Diagnosis not present

## 2022-07-25 ENCOUNTER — Other Ambulatory Visit: Payer: Self-pay | Admitting: Neurosurgery

## 2022-07-27 NOTE — Pre-Procedure Instructions (Signed)
Surgical Instructions    Your procedure is scheduled on July 31, 2022.  Report to [facility] Main Entrance "A" at 11:00 A.M., then check in with the Admitting office.  Call this number if you have problems the morning of surgery:  [phone]  If you have any questions prior to your surgery date call [phone]: Open Monday-Friday 8am-4pm If you experience any cold or flu symptoms such as cough, fever, chills, shortness of breath, etc. between now and your scheduled surgery, please notify us at the above number.     Remember:  Do not eat or drink after midnight the night before your surgery      Take these medicines the morning of surgery with A SIP OF WATER:  ezetimibe (ZETIA)   simvastatin (ZOCOR)   Polyethyl Glycol-Propyl Glycol (SYSTANE OP) - may take if needed   Follow your surgeon's instructions on when to stop Aspirin.  If no instructions were given by your surgeon then you will need to call the office to get those instructions.     As of today, STOP taking any Aleve, Naproxen, Ibuprofen, Motrin, Advil, Goody's, BC's, all herbal medications, fish oil, and all vitamins.                     Do NOT Smoke (Tobacco/Vaping) for 24 hours prior to your procedure.  If you use a CPAP at night, you may bring your mask/headgear for your overnight stay.   Contacts, glasses, piercing's, hearing aid's, dentures or partials may not be worn into surgery, please bring cases for these belongings.    For patients admitted to the hospital, discharge time will be determined by your treatment team.   Patients discharged the day of surgery will not be allowed to drive home, and someone needs to stay with them for 24 hours.  SURGICAL WAITING ROOM VISITATION Patients having surgery or a procedure may have no more than 2 support people in the waiting area - these visitors may rotate.   Children under the age of 26 must have an adult with them who is not the patient. If the patient needs to  stay at the hospital during part of their recovery, the visitor guidelines for inpatient rooms apply. Pre-op nurse will coordinate an appropriate time for 1 support person to accompany patient in pre-op.  This support person may not rotate.   Please refer to the [facility] website for the visitor guidelines for Inpatients (after your surgery is over and you are in a regular room).    Special instructions:   [facility]- Preparing For Surgery  Before surgery, you can play an important role. Because skin is not sterile, your skin needs to be as free of germs as possible. You can reduce the number of germs on your skin by washing with CHG (chlorahexidine gluconate) Soap before surgery.  CHG is an antiseptic cleaner which kills germs and bonds with the skin to continue killing germs even after washing.    Oral Hygiene is also important to reduce your risk of infection.  Remember - BRUSH YOUR TEETH THE MORNING OF SURGERY WITH YOUR REGULAR TOOTHPASTE  Please do not use if you have an allergy to CHG or antibacterial soaps. If your skin becomes reddened/irritated stop using the CHG.  Do not shave (including legs and underarms) for at least 48 hours prior to first CHG shower. It is OK to shave your face.  Please follow these instructions carefully.   Shower the [employer] BEFORE SURGERY  and the MORNING OF SURGERY  If you chose to wash your hair, wash your hair first as usual with your normal shampoo.  After you shampoo, rinse your hair and body thoroughly to remove the shampoo.  Use CHG Soap as you would any other liquid soap. You can apply CHG directly to the skin and wash gently with a scrungie or a clean washcloth.   Apply the CHG Soap to your body ONLY FROM THE NECK DOWN.  Do not use on open wounds or open sores. Avoid contact with your eyes, ears, mouth and genitals (private parts). Wash Face and genitals (private parts)  with your normal soap.   Wash thoroughly, paying special attention to the  area where your surgery will be performed.  Thoroughly rinse your body with warm water from the neck down.  DO NOT shower/wash with your normal soap after using and rinsing off the CHG Soap.  Pat yourself dry with a CLEAN TOWEL.  Wear CLEAN PAJAMAS to bed the night before surgery  Place CLEAN SHEETS on your bed the night before your surgery  DO NOT SLEEP WITH PETS.   Day of Surgery: Take a shower with CHG soap. Do not wear jewelry or makeup Do not wear lotions, powders, perfumes/colognes, or deodorant. Do not shave 48 hours prior to surgery.  Men may shave face and neck. Do not bring valuables to the hospital.  [facility] is not responsible for any belongings or valuables. Do not wear nail polish, gel polish, artificial nails, or any other type of covering on natural nails (fingers and toes) If you have artificial nails or gel coating that need to be removed by a nail salon, please have this removed prior to surgery. Artificial nails or gel coating may interfere with anesthesia's ability to adequately monitor your vital signs.  Wear Clean/Comfortable clothing the morning of surgery Remember to brush your teeth WITH YOUR REGULAR TOOTHPASTE.   Please read over the following fact sheets that you were given.    If you received a COVID test during your pre-op visit  it is requested that you wear a mask when out in public, stay away from anyone that may not be feeling well and notify your surgeon if you develop symptoms. If you have been in contact with anyone that has tested positive in the last 10 days please notify you surgeon.

## 2022-07-30 ENCOUNTER — Encounter (HOSPITAL_COMMUNITY)
Admission: RE | Admit: 2022-07-30 | Discharge: 2022-07-30 | Disposition: A | Discharge status: Home / Self Care | Payer: Medicare Other | Source: Ambulatory Visit | Attending: Neurosurgery | Admitting: Neurosurgery

## 2022-07-30 ENCOUNTER — Other Ambulatory Visit: Payer: Self-pay

## 2022-07-30 ENCOUNTER — Encounter (HOSPITAL_COMMUNITY): Payer: Self-pay

## 2022-07-30 VITALS — BP 141/74 | HR 65 | Temp 98.0°F | Resp 17 | Ht 71.0 in | Wt 176.9 lb

## 2022-07-30 DIAGNOSIS — C712 Malignant neoplasm of temporal lobe: Secondary | ICD-10-CM | POA: Diagnosis not present

## 2022-07-30 DIAGNOSIS — Z85828 Personal history of other malignant neoplasm of skin: Secondary | ICD-10-CM | POA: Diagnosis not present

## 2022-07-30 DIAGNOSIS — Z9889 Other specified postprocedural states: Secondary | ICD-10-CM | POA: Diagnosis present

## 2022-07-30 DIAGNOSIS — G936 Cerebral edema: Secondary | ICD-10-CM | POA: Diagnosis not present

## 2022-07-30 DIAGNOSIS — D320 Benign neoplasm of cerebral meninges: Secondary | ICD-10-CM | POA: Diagnosis not present

## 2022-07-30 DIAGNOSIS — Z87442 Personal history of urinary calculi: Secondary | ICD-10-CM | POA: Diagnosis not present

## 2022-07-30 DIAGNOSIS — I615 Nontraumatic intracerebral hemorrhage, intraventricular: Secondary | ICD-10-CM | POA: Diagnosis not present

## 2022-07-30 DIAGNOSIS — I10 Essential (primary) hypertension: Secondary | ICD-10-CM | POA: Insufficient documentation

## 2022-07-30 DIAGNOSIS — E785 Hyperlipidemia, unspecified: Secondary | ICD-10-CM | POA: Diagnosis not present

## 2022-07-30 DIAGNOSIS — I700 Atherosclerosis of aorta: Secondary | ICD-10-CM | POA: Diagnosis present

## 2022-07-30 DIAGNOSIS — I2510 Atherosclerotic heart disease of native coronary artery without angina pectoris: Secondary | ICD-10-CM | POA: Insufficient documentation

## 2022-07-30 DIAGNOSIS — Z79899 Other long term (current) drug therapy: Secondary | ICD-10-CM | POA: Diagnosis not present

## 2022-07-30 DIAGNOSIS — Z01812 Encounter for preprocedural laboratory examination: Secondary | ICD-10-CM | POA: Diagnosis not present

## 2022-07-30 DIAGNOSIS — I712 Thoracic aortic aneurysm, without rupture, unspecified: Secondary | ICD-10-CM | POA: Diagnosis present

## 2022-07-30 DIAGNOSIS — Z9842 Cataract extraction status, left eye: Secondary | ICD-10-CM | POA: Diagnosis not present

## 2022-07-30 DIAGNOSIS — N400 Enlarged prostate without lower urinary tract symptoms: Secondary | ICD-10-CM | POA: Insufficient documentation

## 2022-07-30 DIAGNOSIS — D329 Benign neoplasm of meninges, unspecified: Secondary | ICD-10-CM | POA: Diagnosis not present

## 2022-07-30 DIAGNOSIS — Z01818 Encounter for other preprocedural examination: Secondary | ICD-10-CM

## 2022-07-30 DIAGNOSIS — Z7982 Long term (current) use of aspirin: Secondary | ICD-10-CM | POA: Diagnosis not present

## 2022-07-30 DIAGNOSIS — D239 Other benign neoplasm of skin, unspecified: Secondary | ICD-10-CM | POA: Diagnosis not present

## 2022-07-30 DIAGNOSIS — Z9841 Cataract extraction status, right eye: Secondary | ICD-10-CM | POA: Diagnosis not present

## 2022-07-30 HISTORY — DX: Personal history of urinary calculi: Z87.442

## 2022-07-30 LAB — CBC
HCT: 44 % (ref 39.0–52.0)
Hemoglobin: 14.9 g/dL (ref 13.0–17.0)
MCH: 31.2 pg (ref 26.0–34.0)
MCHC: 33.9 g/dL (ref 30.0–36.0)
MCV: 92.2 fL (ref 80.0–100.0)
Platelets: 242 10*3/uL (ref 150–400)
RBC: 4.77 MIL/uL (ref 4.22–5.81)
RDW: 13.1 % (ref 11.5–15.5)
WBC: 7.5 10*3/uL (ref 4.0–10.5)
nRBC: 0 % (ref 0.0–0.2)

## 2022-07-30 LAB — BASIC METABOLIC PANEL
Anion gap: 8 (ref 5–15)
BUN: 17 mg/dL (ref 8–23)
CO2: 26 mmol/L (ref 22–32)
Calcium: 9.4 mg/dL (ref 8.9–10.3)
Chloride: 103 mmol/L (ref 98–111)
Creatinine, Ser: 0.91 mg/dL (ref 0.61–1.24)
GFR, Estimated: >60 mL/min (ref >60)
Glucose, Bld: 102 mg/dL — ABNORMAL HIGH (ref 70–99)
Potassium: 4 mmol/L (ref 3.5–5.1)
Sodium: 137 mmol/L (ref 135–145)

## 2022-07-30 LAB — TYPE AND SCREEN
ABO/RH(D): O POS
Antibody Screen: NEGATIVE

## 2022-07-30 NOTE — Progress Notes (Signed)
PCP - Dr. Adrian Prince Cardiologist - Dr. Julien Nordmann (Last Office Visit March 2024)  PPM/ICD - Denies  Device Orders - n/a Rep Notified - n/a  Chest x-ray - Denies EKG - 05/15/2022 Stress Test - Per pt, 10+ years ago in [facility] ECHO - 02/11/2020 Cardiac Cath - Denies  Sleep Study - Denies CPAP - n/a  No DM  Last dose of GLP1 agonist- n/a GLP1 instructions: n/a  Blood Thinner Instructions: n/a Aspirin Instructions: Pts last dose of ASA 07/20/22  NPO after midnight  COVID TEST- n/a   Anesthesia review: Yes. Last cardiology visit March 2024. Pt also has HTN, mild AAA.   Patient denies shortness of breath, fever, cough and chest pain at PAT appointment. Pt denies any respiratory illness/infection in the last two months.   All instructions explained to the patient, with a verbal understanding of the material. Patient agrees to go over the instructions while at home for a better understanding. Patient also instructed to self quarantine after being tested for COVID-19. The opportunity to ask questions was provided.

## 2022-07-30 NOTE — Anesthesia Preprocedure Evaluation (Signed)
Anesthesia Evaluation    Reviewed: Allergy & Precautions, Patient's Chart, lab work & pertinent test results  Airway Mallampati: II  TM Distance: >3 FB Neck ROM: Full    Dental no notable dental hx.    Pulmonary neg pulmonary ROS   Pulmonary exam normal breath sounds clear to auscultation       Cardiovascular hypertension, Pt. on medications Normal cardiovascular exam Rhythm:Regular Rate:Normal  Echo 02/11/2020:   IMPRESSIONS     1. Left ventricular ejection fraction, by estimation, is 60 to 65%. The  left ventricle has normal function. The left ventricle has no regional  wall motion abnormalities. Left ventricular diastolic parameters are  consistent with Grade II diastolic  dysfunction (pseudonormalization). The average left ventricular global  longitudinal strain is -18.8 %. The global longitudinal strain is normal.   2. Right ventricular systolic function is normal. The right ventricular  size is normal.   3. Left atrial size was mildly dilated.   4. The mitral valve is normal in structure. Mild mitral valve  regurgitation.   5. Mild aortic valve sclerosis is present, with no evidence of aortic  valve stenosis.   6. There is mild dilatation of the aortic root, measuring 41 mm.Ascending  aorta was not well visualized. Arch is 3.0 cm   Comparison(s): 01/27/19 CT Cardiac scoring showed thoracic aneurysm  measuring 40 mm.    CT calcium score 01/27/2019:  IMPRESSION: Coronary calcium score of 489. This was 16 percentile for age and sex matched control.    Neuro/Psych    GI/Hepatic negative GI ROS, Neg liver ROS,,,  Endo/Other  negative endocrine ROS    Renal/GU negative Renal ROS     Musculoskeletal negative musculoskeletal ROS (+)    Abdominal   Peds  Hematology negative hematology ROS (+)   Anesthesia Other Findings   Reproductive/Obstetrics                              Anesthesia Physical Anesthesia Plan  ASA: 3  Anesthesia Plan: General   Post-op Pain Management: Tylenol PO (pre-op)*   Induction: Intravenous  PONV Risk Score and Plan: 3 and Ondansetron, Dexamethasone and Diphenhydramine  Airway Management Planned: Oral ETT  Additional Equipment: Arterial line  Intra-op Plan:   Post-operative Plan: Extubation in OR and Possible Post-op intubation/ventilation  Informed Consent:   Plan Discussed with: Anesthesiologist  Anesthesia Plan Comments: (See PAT note from 6/24 by Sherlie Ban PA-C )        Anesthesia Quick Evaluation

## 2022-07-30 NOTE — Progress Notes (Signed)
Choose an anesthesia record to view details        DISCUSSION: Alexander Frazier that is a 77 year old male who presents to PAT as a same-day workup for craniotomy and resection of meningioma by Dr. Conchita Paris on 07/31/2022.  Past medical history significant for hypertension, CAD, diastolic dysfunction, thoracic aortic aneurysm, BPH, memory difficulties.  MRI of the brain was obtained on 07/19/2022 due to cognitive change with facial droop which showed a meningioma.  Now scheduled for resection with neurosurgery.  No prior anesthesia complications  Patient follows with cardiology and was last seen on 05/15/2022 by Dr. Mariah Milling.  Noted to be doing well from a cardiac standpoint with good functional status.  Has history of abnormal EKGs which she has had multiple stress testing in the past for this with normal results.  Prior CT scan showed calcium score of 489 in the 64th percentile.  His TAA is borderline dilated at 3.8cm on CTA Chest in 2022.  No further testing was recommended at most recent office visit and he was advised to follow-up in 1 year.  VS: BP (!) 141/74   Pulse 65   Temp 36.7 C   Resp 17   Ht 5\' 11"  (1.803 m)   Wt 80.2 kg   SpO2 100%   BMI 24.67 kg/m   PROVIDERS: Adrian Prince, MD Cardiology: Julien Nordmann, MD  LABS: Labs reviewed: Acceptable for surgery. (all labs ordered are listed, but only abnormal results are displayed)  Labs Reviewed  CBC  BASIC METABOLIC PANEL  TYPE AND SCREEN     IMAGES:  MRI brain 07/19/22:  IMPRESSION: 1. Enhancing and partially calcified mass centered within the right paraclinoid and cavernous sinus regions, and medial aspect of the right middle cranial fossa. Contiguous dural thickening and enhancement extending elsewhere along the right temporal lobe, along the right tentorium and along the right aspect of the clivus. This constellation of imaging findings is most consistent with a meningioma. There is mass effect with prominent vasogenic  edema (greatest within the right temporal and parietal lobes). Additionally, displaced right temporal lobe parenchyma exerts mild mass effect upon the right aspect of the midbrain. Minimal leftward midline shift at the level of the septum pellucidum. 2. 6 mm hypoenhancing lesion within the posterior aspect of the pituitary gland, which may reflect a cyst or pituitary microadenoma. No further imaging evaluation or imaging follow-up is necessary. Consider endocrine function tests and correlate for a history of pituitary hypersecretion. This follows ACR consensus guidelines: Management of Incidental Pituitary Findings on CT, MRI and F18-FDG PET: A White Paper of the ACR Incidental Findings Committee. J Am Coll Radiol 2018; 15: 960-45.   3. Background minimal chronic small-vessel ischemic disease and mild generalized parenchymal atrophy. 4. Minor paranasal sinus disease.  CTA Chest 02/24/20:  IMPRESSION: Maximum diameter of the ascending aorta is estimated 3.8 cm on the current non gated CT. A more precise measurement could be made with a gated CTA chest, if indicated.   Aortic atherosclerosis and associated coronary artery disease. Aortic Atherosclerosis (ICD10-I70.0).   Additional ancillary findings as above   EKG 05/15/22:  Normal sinus rhythm rate 84 bpm T wave inversions precordial leads, unchanged from prior EKGs    CV:  Echo 02/11/2020:  IMPRESSIONS     1. Left ventricular ejection fraction, by estimation, is 60 to 65%. The  left ventricle has normal function. The left ventricle has no regional  wall motion abnormalities. Left ventricular diastolic parameters are  consistent with Grade II diastolic  dysfunction (  pseudonormalization). The average left ventricular global  longitudinal strain is -18.8 %. The global longitudinal strain is normal.   2. Right ventricular systolic function is normal. The right ventricular  size is normal.   3. Left atrial size was mildly  dilated.   4. The mitral valve is normal in structure. Mild mitral valve  regurgitation.   5. Mild aortic valve sclerosis is present, with no evidence of aortic  valve stenosis.   6. There is mild dilatation of the aortic root, measuring 41 mm.Ascending  aorta was not well visualized. Arch is 3.0 cm   Comparison(s): 01/27/19 CT Cardiac scoring showed thoracic aneurysm  measuring 40 mm.   CT calcium score 01/27/2019:  IMPRESSION: Coronary calcium score of 489. This was 31 percentile for age and sex matched control.      Past Medical History:  Diagnosis Date   BPH (benign prostatic hyperplasia)    CAD (coronary artery disease)    Diastolic dysfunction    Diverticulosis large intestine w/o perforation or abscess w/o bleeding    Forgetfulness    History of kidney stones    Patient reports 10 plus years   Hyperlipidemia    Hypertension    Skin cancer    Thoracic aortic aneurysm (HCC)     Past Surgical History:  Procedure Laterality Date   APPENDECTOMY     CATARACT EXTRACTION Bilateral 2018   COLON SURGERY     pateint reports "colonoscopy over 6 years"   HERNIA REPAIR     MOHS SURGERY     VASECTOMY      MEDICATIONS:  aspirin 81 MG EC tablet   enalapril (VASOTEC) 10 MG tablet   ezetimibe (ZETIA) 10 MG tablet   Multiple Vitamins-Minerals (DAILY MULTIVITAMIN) CAPS   naproxen sodium (ALEVE) 220 MG tablet   Polyethyl Glycol-Propyl Glycol (SYSTANE OP)   simvastatin (ZOCOR) 20 MG tablet   No current facility-administered medications for this encounter.   Marcille Blanco [facility] Surgical Short Stay/Anesthesiology [facility] Phone [phone] 07/30/2022 3:55 PM

## 2022-07-31 ENCOUNTER — Encounter (HOSPITAL_COMMUNITY): Payer: Self-pay | Admitting: Neurosurgery

## 2022-07-31 ENCOUNTER — Other Ambulatory Visit: Payer: Self-pay

## 2022-07-31 ENCOUNTER — Inpatient Hospital Stay (HOSPITAL_COMMUNITY)
Admission: RE | Admit: 2022-07-31 | Discharge: 2022-08-02 | DRG: 025 | Disposition: A | Discharge status: Home / Self Care | Payer: Medicare Other | Attending: Neurosurgery | Admitting: Neurosurgery

## 2022-07-31 ENCOUNTER — Inpatient Hospital Stay (HOSPITAL_COMMUNITY): Payer: Medicare Other | Admitting: Medical

## 2022-07-31 ENCOUNTER — Encounter (HOSPITAL_COMMUNITY)
Admission: RE | Disposition: A | Discharge status: Home / Self Care | Payer: Self-pay | Source: Home / Self Care | Attending: Neurosurgery

## 2022-07-31 ENCOUNTER — Inpatient Hospital Stay (HOSPITAL_COMMUNITY): Payer: Medicare Other | Admitting: Registered Nurse

## 2022-07-31 DIAGNOSIS — I712 Thoracic aortic aneurysm, without rupture, unspecified: Secondary | ICD-10-CM | POA: Diagnosis present

## 2022-07-31 DIAGNOSIS — I700 Atherosclerosis of aorta: Secondary | ICD-10-CM | POA: Diagnosis present

## 2022-07-31 DIAGNOSIS — G936 Cerebral edema: Secondary | ICD-10-CM | POA: Diagnosis present

## 2022-07-31 DIAGNOSIS — I10 Essential (primary) hypertension: Secondary | ICD-10-CM

## 2022-07-31 DIAGNOSIS — Z01812 Encounter for preprocedural laboratory examination: Secondary | ICD-10-CM

## 2022-07-31 DIAGNOSIS — D239 Other benign neoplasm of skin, unspecified: Secondary | ICD-10-CM | POA: Diagnosis not present

## 2022-07-31 DIAGNOSIS — Z9842 Cataract extraction status, left eye: Secondary | ICD-10-CM | POA: Diagnosis not present

## 2022-07-31 DIAGNOSIS — I615 Nontraumatic intracerebral hemorrhage, intraventricular: Secondary | ICD-10-CM | POA: Diagnosis not present

## 2022-07-31 DIAGNOSIS — Z87442 Personal history of urinary calculi: Secondary | ICD-10-CM

## 2022-07-31 DIAGNOSIS — E785 Hyperlipidemia, unspecified: Secondary | ICD-10-CM | POA: Diagnosis present

## 2022-07-31 DIAGNOSIS — Z85828 Personal history of other malignant neoplasm of skin: Secondary | ICD-10-CM

## 2022-07-31 DIAGNOSIS — D320 Benign neoplasm of cerebral meninges: Secondary | ICD-10-CM | POA: Diagnosis not present

## 2022-07-31 DIAGNOSIS — I2510 Atherosclerotic heart disease of native coronary artery without angina pectoris: Secondary | ICD-10-CM | POA: Diagnosis present

## 2022-07-31 DIAGNOSIS — Z7982 Long term (current) use of aspirin: Secondary | ICD-10-CM | POA: Diagnosis not present

## 2022-07-31 DIAGNOSIS — D329 Benign neoplasm of meninges, unspecified: Principal | ICD-10-CM | POA: Diagnosis present

## 2022-07-31 DIAGNOSIS — Z9889 Other specified postprocedural states: Principal | ICD-10-CM

## 2022-07-31 DIAGNOSIS — C712 Malignant neoplasm of temporal lobe: Secondary | ICD-10-CM

## 2022-07-31 DIAGNOSIS — Z79899 Other long term (current) drug therapy: Secondary | ICD-10-CM | POA: Diagnosis not present

## 2022-07-31 DIAGNOSIS — N400 Enlarged prostate without lower urinary tract symptoms: Secondary | ICD-10-CM | POA: Diagnosis present

## 2022-07-31 DIAGNOSIS — Z9841 Cataract extraction status, right eye: Secondary | ICD-10-CM

## 2022-07-31 HISTORY — PX: APPLICATION OF CRANIAL NAVIGATION: SHX6578

## 2022-07-31 HISTORY — PX: CRANIOTOMY: SHX93

## 2022-07-31 LAB — ABO/RH: ABO/RH(D): O POS

## 2022-07-31 SURGERY — CRANIOTOMY TUMOR EXCISION
Anesthesia: General | Laterality: Right

## 2022-07-31 MED ORDER — AMISULPRIDE (ANTIEMETIC) 5 MG/2ML IV SOLN: 10.0000 mg | Freq: Once | INTRAVENOUS | Status: DC | PRN

## 2022-07-31 MED ORDER — ACETAMINOPHEN 325 MG PO TABS
650.0000 mg | ORAL_TABLET | ORAL | Status: DC | PRN
  Administered 2022-08-01: 650 mg via ORAL
  Filled 2022-07-31: qty 2

## 2022-07-31 MED ORDER — HYDROMORPHONE HCL 1 MG/ML IJ SOLN
0.2500 mg | INTRAMUSCULAR | Status: DC | PRN
  Administered 2022-07-31 (×2): 0.25 mg via INTRAVENOUS

## 2022-07-31 MED ORDER — SODIUM CHLORIDE 0.9 % IV SOLN: INTRAVENOUS | Status: DC

## 2022-07-31 MED ORDER — ACETAMINOPHEN 500 MG PO TABS
1000.0000 mg | ORAL_TABLET | Freq: Once | ORAL | Status: AC
  Administered 2022-07-31: 1000 mg via ORAL
  Filled 2022-07-31: qty 2

## 2022-07-31 MED ORDER — ADULT MULTIVITAMIN W/MINERALS CH
1.0000 | ORAL_TABLET | Freq: Every day | ORAL | Status: DC
  Administered 2022-08-01 – 2022-08-02 (×2): 1 via ORAL
  Filled 2022-07-31 (×2): qty 1

## 2022-07-31 MED ORDER — HYDROCODONE-ACETAMINOPHEN 5-325 MG PO TABS
1.0000 | ORAL_TABLET | ORAL | Status: DC | PRN
  Administered 2022-07-31: 1 via ORAL
  Filled 2022-07-31: qty 1

## 2022-07-31 MED ORDER — THROMBIN 5000 UNITS EX SOLR
CUTANEOUS | Status: AC
  Filled 2022-07-31: qty 5000

## 2022-07-31 MED ORDER — DEXAMETHASONE SODIUM PHOSPHATE 10 MG/ML IJ SOLN
6.0000 mg | Freq: Four times a day (QID) | INTRAMUSCULAR | Status: AC
  Administered 2022-07-31 – 2022-08-01 (×4): 6 mg via INTRAVENOUS
  Filled 2022-07-31 (×4): qty 1

## 2022-07-31 MED ORDER — LIDOCAINE 2% (20 MG/ML) 5 ML SYRINGE
INTRAMUSCULAR | Status: AC
  Filled 2022-07-31: qty 5

## 2022-07-31 MED ORDER — THROMBIN 5000 UNITS EX SOLR
OROMUCOSAL | Status: DC | PRN
  Administered 2022-07-31: 5 mL via TOPICAL

## 2022-07-31 MED ORDER — OXYCODONE HCL 5 MG PO TABS: 5.0000 mg | ORAL_TABLET | Freq: Once | ORAL | Status: DC | PRN

## 2022-07-31 MED ORDER — ALBUMIN HUMAN 5 % IV SOLN: INTRAVENOUS | Status: DC | PRN

## 2022-07-31 MED ORDER — PROPOFOL 10 MG/ML IV BOLUS
INTRAVENOUS | Status: AC
  Filled 2022-07-31: qty 20

## 2022-07-31 MED ORDER — CEFAZOLIN SODIUM-DEXTROSE 2-4 GM/100ML-% IV SOLN
2.0000 g | INTRAVENOUS | Status: AC
  Administered 2022-07-31: 200 g via INTRAVENOUS
  Administered 2022-07-31: 2 g via INTRAVENOUS
  Filled 2022-07-31: qty 100

## 2022-07-31 MED ORDER — ACETAMINOPHEN 650 MG RE SUPP: 650.0000 mg | RECTAL | Status: DC | PRN

## 2022-07-31 MED ORDER — PROMETHAZINE HCL 25 MG PO TABS: 12.5000 mg | ORAL_TABLET | ORAL | Status: DC | PRN

## 2022-07-31 MED ORDER — PANTOPRAZOLE SODIUM 40 MG IV SOLR
40.0000 mg | Freq: Every day | INTRAVENOUS | Status: DC
  Administered 2022-07-31 – 2022-08-01 (×2): 40 mg via INTRAVENOUS
  Filled 2022-07-31 (×2): qty 10

## 2022-07-31 MED ORDER — ENALAPRIL MALEATE 10 MG PO TABS
10.0000 mg | ORAL_TABLET | Freq: Every day | ORAL | Status: DC
  Administered 2022-08-01 – 2022-08-02 (×2): 10 mg via ORAL
  Filled 2022-07-31 (×2): qty 1

## 2022-07-31 MED ORDER — PHENYLEPHRINE 80 MCG/ML (10ML) SYRINGE FOR IV PUSH (FOR BLOOD PRESSURE SUPPORT)
PREFILLED_SYRINGE | INTRAVENOUS | Status: DC | PRN
  Administered 2022-07-31 (×2): 80 ug via INTRAVENOUS

## 2022-07-31 MED ORDER — HYDROMORPHONE HCL 1 MG/ML IJ SOLN
INTRAMUSCULAR | Status: AC
  Filled 2022-07-31: qty 1

## 2022-07-31 MED ORDER — MORPHINE SULFATE (PF) 2 MG/ML IV SOLN
1.0000 mg | INTRAVENOUS | Status: DC | PRN
  Administered 2022-08-01: 1 mg via INTRAVENOUS
  Filled 2022-07-31: qty 1

## 2022-07-31 MED ORDER — EZETIMIBE 10 MG PO TABS
10.0000 mg | ORAL_TABLET | Freq: Every day | ORAL | Status: DC
  Administered 2022-08-01 – 2022-08-02 (×2): 10 mg via ORAL
  Filled 2022-07-31 (×2): qty 1

## 2022-07-31 MED ORDER — ONDANSETRON HCL 4 MG/2ML IJ SOLN
INTRAMUSCULAR | Status: AC
  Filled 2022-07-31: qty 2

## 2022-07-31 MED ORDER — LIDOCAINE-EPINEPHRINE 1 %-1:100000 IJ SOLN
INTRAMUSCULAR | Status: AC
  Filled 2022-07-31: qty 1

## 2022-07-31 MED ORDER — OXYCODONE HCL 5 MG/5ML PO SOLN: 5.0000 mg | Freq: Once | ORAL | Status: DC | PRN

## 2022-07-31 MED ORDER — DEXAMETHASONE SODIUM PHOSPHATE 4 MG/ML IJ SOLN
4.0000 mg | Freq: Four times a day (QID) | INTRAMUSCULAR | Status: AC
  Administered 2022-08-01 – 2022-08-02 (×4): 4 mg via INTRAVENOUS
  Filled 2022-07-31 (×4): qty 1

## 2022-07-31 MED ORDER — POLYETHYL GLYCOL-PROPYL GLYCOL 0.4-0.3 % OP GEL: 1.0000 | Freq: Every day | OPHTHALMIC | Status: DC | PRN

## 2022-07-31 MED ORDER — HEMOSTATIC AGENTS (NO CHARGE) OPTIME
TOPICAL | Status: DC | PRN
  Administered 2022-07-31: 1 via TOPICAL

## 2022-07-31 MED ORDER — SIMVASTATIN 20 MG PO TABS
20.0000 mg | ORAL_TABLET | Freq: Every day | ORAL | Status: DC
  Administered 2022-07-31 – 2022-08-01 (×2): 20 mg via ORAL
  Filled 2022-07-31 (×2): qty 1

## 2022-07-31 MED ORDER — THROMBIN 20000 UNITS EX SOLR
CUTANEOUS | Status: DC | PRN
  Administered 2022-07-31: 20 mL via TOPICAL

## 2022-07-31 MED ORDER — BUPIVACAINE HCL (PF) 0.5 % IJ SOLN
INTRAMUSCULAR | Status: AC
  Filled 2022-07-31: qty 30

## 2022-07-31 MED ORDER — LIDOCAINE 2% (20 MG/ML) 5 ML SYRINGE
INTRAMUSCULAR | Status: DC | PRN
  Administered 2022-07-31: 80 mg via INTRAVENOUS

## 2022-07-31 MED ORDER — BISACODYL 10 MG RE SUPP: 10.0000 mg | Freq: Every day | RECTAL | Status: DC | PRN

## 2022-07-31 MED ORDER — LEVETIRACETAM IN NACL 500 MG/100ML IV SOLN
500.0000 mg | Freq: Two times a day (BID) | INTRAVENOUS | Status: DC
  Administered 2022-07-31 – 2022-08-02 (×4): 500 mg via INTRAVENOUS
  Filled 2022-07-31 (×4): qty 100

## 2022-07-31 MED ORDER — ROCURONIUM BROMIDE 10 MG/ML (PF) SYRINGE
PREFILLED_SYRINGE | INTRAVENOUS | Status: AC
  Filled 2022-07-31: qty 10

## 2022-07-31 MED ORDER — FENTANYL CITRATE (PF) 250 MCG/5ML IJ SOLN
INTRAMUSCULAR | Status: DC | PRN
  Administered 2022-07-31: 100 ug via INTRAVENOUS
  Administered 2022-07-31 (×3): 50 ug via INTRAVENOUS

## 2022-07-31 MED ORDER — BUPIVACAINE HCL (PF) 0.5 % IJ SOLN
INTRAMUSCULAR | Status: DC | PRN
  Administered 2022-07-31: 8 mL

## 2022-07-31 MED ORDER — ACETAMINOPHEN 10 MG/ML IV SOLN: 1000.0000 mg | Freq: Once | INTRAVENOUS | Status: DC | PRN

## 2022-07-31 MED ORDER — DEXAMETHASONE SODIUM PHOSPHATE 10 MG/ML IJ SOLN
INTRAMUSCULAR | Status: DC | PRN
  Administered 2022-07-31: 10 mg via INTRAVENOUS

## 2022-07-31 MED ORDER — ONDANSETRON HCL 4 MG/2ML IJ SOLN: 4.0000 mg | INTRAMUSCULAR | Status: DC | PRN

## 2022-07-31 MED ORDER — LABETALOL HCL 5 MG/ML IV SOLN
10.0000 mg | INTRAVENOUS | Status: DC | PRN
  Administered 2022-07-31: 40 mg via INTRAVENOUS
  Administered 2022-07-31: 20 mg via INTRAVENOUS
  Filled 2022-07-31: qty 8
  Filled 2022-07-31: qty 4

## 2022-07-31 MED ORDER — PHENYLEPHRINE HCL-NACL 20-0.9 MG/250ML-% IV SOLN
INTRAVENOUS | Status: DC | PRN
  Administered 2022-07-31: 10 ug/min via INTRAVENOUS

## 2022-07-31 MED ORDER — SENNOSIDES-DOCUSATE SODIUM 8.6-50 MG PO TABS: 1.0000 | ORAL_TABLET | Freq: Every evening | ORAL | Status: DC | PRN

## 2022-07-31 MED ORDER — SUGAMMADEX SODIUM 200 MG/2ML IV SOLN
INTRAVENOUS | Status: DC | PRN
  Administered 2022-07-31: 160 mg via INTRAVENOUS

## 2022-07-31 MED ORDER — FENTANYL CITRATE (PF) 100 MCG/2ML IJ SOLN: 25.0000 ug | INTRAMUSCULAR | Status: DC | PRN

## 2022-07-31 MED ORDER — LACTATED RINGERS IV SOLN
INTRAVENOUS | Status: DC
Start: 2022-07-31 — End: 2022-07-31

## 2022-07-31 MED ORDER — CEFAZOLIN SODIUM-DEXTROSE 1-4 GM/50ML-% IV SOLN
1.0000 g | Freq: Three times a day (TID) | INTRAVENOUS | Status: AC
  Administered 2022-07-31 – 2022-08-01 (×2): 1 g via INTRAVENOUS
  Filled 2022-07-31 (×2): qty 50

## 2022-07-31 MED ORDER — BACITRACIN ZINC 500 UNIT/GM EX OINT
TOPICAL_OINTMENT | CUTANEOUS | Status: AC
  Filled 2022-07-31: qty 28.35

## 2022-07-31 MED ORDER — ONDANSETRON HCL 4 MG/2ML IJ SOLN
INTRAMUSCULAR | Status: DC | PRN
  Administered 2022-07-31: 4 mg via INTRAVENOUS

## 2022-07-31 MED ORDER — CHLORHEXIDINE GLUCONATE CLOTH 2 % EX PADS
6.0000 | MEDICATED_PAD | Freq: Once | CUTANEOUS | Status: DC
  Administered 2022-07-31: 6 via TOPICAL

## 2022-07-31 MED ORDER — FENTANYL CITRATE (PF) 250 MCG/5ML IJ SOLN
INTRAMUSCULAR | Status: AC
  Filled 2022-07-31: qty 5

## 2022-07-31 MED ORDER — ONDANSETRON HCL 4 MG PO TABS: 4.0000 mg | ORAL_TABLET | ORAL | Status: DC | PRN

## 2022-07-31 MED ORDER — CHLORHEXIDINE GLUCONATE CLOTH 2 % EX PADS: 6.0000 | MEDICATED_PAD | Freq: Once | CUTANEOUS | Status: DC

## 2022-07-31 MED ORDER — DEXAMETHASONE SODIUM PHOSPHATE 4 MG/ML IJ SOLN: 4.0000 mg | Freq: Three times a day (TID) | INTRAMUSCULAR | Status: DC

## 2022-07-31 MED ORDER — BACITRACIN ZINC 500 UNIT/GM EX OINT
TOPICAL_OINTMENT | CUTANEOUS | Status: DC | PRN
  Administered 2022-07-31: 1 via TOPICAL

## 2022-07-31 MED ORDER — ARTIFICIAL TEARS OPHTHALMIC OINT
TOPICAL_OINTMENT | OPHTHALMIC | Status: AC
  Filled 2022-07-31: qty 3.5

## 2022-07-31 MED ORDER — ORAL CARE MOUTH RINSE: 15.0000 mL | Freq: Once | OROMUCOSAL | Status: AC

## 2022-07-31 MED ORDER — PROPOFOL 10 MG/ML IV BOLUS
INTRAVENOUS | Status: DC | PRN
  Administered 2022-07-31: 150 mg via INTRAVENOUS

## 2022-07-31 MED ORDER — DEXAMETHASONE SODIUM PHOSPHATE 10 MG/ML IJ SOLN
INTRAMUSCULAR | Status: AC
  Filled 2022-07-31: qty 1

## 2022-07-31 MED ORDER — PROMETHAZINE HCL 25 MG/ML IJ SOLN: 6.2500 mg | INTRAMUSCULAR | Status: DC | PRN

## 2022-07-31 MED ORDER — THROMBIN 20000 UNITS EX SOLR
CUTANEOUS | Status: AC
  Filled 2022-07-31: qty 20000

## 2022-07-31 MED ORDER — 0.9 % SODIUM CHLORIDE (POUR BTL) OPTIME
TOPICAL | Status: DC | PRN
  Administered 2022-07-31 (×3): 1000 mL

## 2022-07-31 MED ORDER — LIDOCAINE-EPINEPHRINE 1 %-1:100000 IJ SOLN
INTRAMUSCULAR | Status: DC | PRN
  Administered 2022-07-31: 8 mL

## 2022-07-31 MED ORDER — ROCURONIUM BROMIDE 10 MG/ML (PF) SYRINGE
PREFILLED_SYRINGE | INTRAVENOUS | Status: DC | PRN
  Administered 2022-07-31: 30 mg via INTRAVENOUS
  Administered 2022-07-31: 20 mg via INTRAVENOUS
  Administered 2022-07-31: 80 mg via INTRAVENOUS

## 2022-07-31 MED ORDER — CHLORHEXIDINE GLUCONATE 0.12 % MT SOLN
15.0000 mL | Freq: Once | OROMUCOSAL | Status: AC
  Administered 2022-07-31: 15 mL via OROMUCOSAL
  Filled 2022-07-31: qty 15

## 2022-07-31 SURGICAL SUPPLY — 108 items
APL SKNCLS NONHYPOALLERGENIC (GAUZE/BANDAGES/DRESSINGS)
APL SKNCLS STERI-STRIP NONHPOA (GAUZE/BANDAGES/DRESSINGS)
BAG COUNTER SPONGE SURGICOUNT (BAG) ×1 IMPLANT
BAG SPNG CNTER NS LX DISP (BAG) ×1
BENZOIN TINCTURE PRP APPL 2/3 (GAUZE/BANDAGES/DRESSINGS) IMPLANT
BLADE CLIPPER SURG (BLADE) ×1 IMPLANT
BLADE SAW GIGLI 16 STRL (MISCELLANEOUS) IMPLANT
BLADE SURG 15 STRL LF DISP TIS (BLADE) IMPLANT
BLADE SURG 15 STRL SS (BLADE)
BLADE ULTRA TIP 2M (BLADE) ×1 IMPLANT
BNDG CMPR 75X41 PLY HI ABS (GAUZE/BANDAGES/DRESSINGS) ×2
BNDG GAUZE DERMACEA FLUFF 4 (GAUZE/BANDAGES/DRESSINGS) IMPLANT
BNDG GZE DERMACEA 4 6PLY (GAUZE/BANDAGES/DRESSINGS) ×2
BNDG STRETCH 4X75 STRL LF (GAUZE/BANDAGES/DRESSINGS) IMPLANT
BUR ROUND FLUTED 5 RND (BURR) IMPLANT
BUR SPIRAL ROUTER 2.3 (BUR) ×1 IMPLANT
CANISTER SUCT 3000ML PPV (MISCELLANEOUS) ×2 IMPLANT
CASSETTE SUCT IRRIG SONOPET IQ (MISCELLANEOUS) IMPLANT
CATH VENTRIC 35X38 W/TROCAR LG (CATHETERS) IMPLANT
CLIP TI MEDIUM 6 (CLIP) IMPLANT
CNTNR URN SCR LID CUP LEK RST (MISCELLANEOUS) ×1 IMPLANT
CONT SPEC 4OZ STRL OR WHT (MISCELLANEOUS) ×1
COVER MAYO STAND STRL (DRAPES) IMPLANT
COVERAGE SUPPORT O-ARM STEALTH (MISCELLANEOUS) ×1 IMPLANT
DRAIN SUBARACHNOID (WOUND CARE) IMPLANT
DRAPE HALF SHEET 40X57 (DRAPES) ×1 IMPLANT
DRAPE MICROSCOPE SLANT 54X150 (MISCELLANEOUS) IMPLANT
DRAPE NEUROLOGICAL W/INCISE (DRAPES) ×1 IMPLANT
DRAPE SHEET LG 3/4 BI-LAMINATE (DRAPES) IMPLANT
DRAPE STERI IOBAN 125X83 (DRAPES) IMPLANT
DRAPE SURG 17X23 STRL (DRAPES) IMPLANT
DRAPE WARM FLUID 44X44 (DRAPES) ×1 IMPLANT
DRSG ADAPTIC 3X8 NADH LF (GAUZE/BANDAGES/DRESSINGS) IMPLANT
DRSG NON-ADHERENT DERMACEA 3X4 (GAUZE/BANDAGES/DRESSINGS) IMPLANT
DRSG TELFA 3X8 NADH STRL (GAUZE/BANDAGES/DRESSINGS) IMPLANT
DURAPREP 6ML APPLICATOR 50/CS (WOUND CARE) ×1 IMPLANT
ELECT BLADE 4.0 EZ CLEAN MEGAD (MISCELLANEOUS) ×1
ELECT REM PT RETURN 9FT ADLT (ELECTROSURGICAL) ×1
ELECTRODE BLDE 4.0 EZ CLN MEGD (MISCELLANEOUS) IMPLANT
ELECTRODE REM PT RTRN 9FT ADLT (ELECTROSURGICAL) ×1 IMPLANT
EVACUATOR 1/8 PVC DRAIN (DRAIN) IMPLANT
EVACUATOR SILICONE 100CC (DRAIN) IMPLANT
FEE COVERAGE SUPPORT O-ARM (MISCELLANEOUS) ×1 IMPLANT
FORCEPS BIPOLAR SPETZLER 8 1.0 (NEUROSURGERY SUPPLIES) ×1 IMPLANT
GAUZE 4X4 16PLY ~~LOC~~+RFID DBL (SPONGE) IMPLANT
GAUZE SPONGE 4X4 12PLY STRL (GAUZE/BANDAGES/DRESSINGS) ×1 IMPLANT
GLOVE BIOGEL PI IND STRL 7.0 (GLOVE) IMPLANT
GLOVE BIOGEL PI IND STRL 7.5 (GLOVE) ×2 IMPLANT
GLOVE ECLIPSE 7.0 STRL STRAW (GLOVE) ×2 IMPLANT
GLOVE EXAM NITRILE XL STR (GLOVE) IMPLANT
GOWN STRL REUS W/ TWL LRG LVL3 (GOWN DISPOSABLE) ×2 IMPLANT
GOWN STRL REUS W/ TWL XL LVL3 (GOWN DISPOSABLE) IMPLANT
GOWN STRL REUS W/TWL 2XL LVL3 (GOWN DISPOSABLE) IMPLANT
GOWN STRL REUS W/TWL LRG LVL3 (GOWN DISPOSABLE) ×5
GOWN STRL REUS W/TWL XL LVL3 (GOWN DISPOSABLE) ×1
HEMOSTAT POWDER KIT SURGIFOAM (HEMOSTASIS) ×1 IMPLANT
HEMOSTAT SURGICEL 2X14 (HEMOSTASIS) ×1 IMPLANT
HOOK DURA 1/2IN (MISCELLANEOUS) ×1 IMPLANT
IV NS 1000ML (IV SOLUTION)
IV NS 1000ML BAXH (IV SOLUTION) ×1 IMPLANT
KIT BASIN OR (CUSTOM PROCEDURE TRAY) ×1 IMPLANT
KIT DRAIN CSF ACCUDRAIN (MISCELLANEOUS) IMPLANT
KIT TURNOVER KIT B (KITS) ×1 IMPLANT
KNIFE ARACHNOID DISP AM-23-SB (BLADE) IMPLANT
KNIFE ARACHNOID DISP AM-24-S (MISCELLANEOUS) ×1 IMPLANT
MARKER SPHERE PSV REFLC NDI (MISCELLANEOUS) ×3 IMPLANT
NDL HYPO 22X1.5 SAFETY MO (MISCELLANEOUS) ×1 IMPLANT
NDL SPNL 18GX3.5 QUINCKE PK (NEEDLE) IMPLANT
NEEDLE HYPO 22X1.5 SAFETY MO (MISCELLANEOUS) ×1 IMPLANT
NEEDLE SPNL 18GX3.5 QUINCKE PK (NEEDLE) IMPLANT
NS IRRIG 1000ML POUR BTL (IV SOLUTION) ×3 IMPLANT
PACK BATTERY CMF DISP FOR DVR (ORTHOPEDIC DISPOSABLE SUPPLIES) IMPLANT
PACK CRANIOTOMY CUSTOM (CUSTOM PROCEDURE TRAY) ×1 IMPLANT
PATTIES SURGICAL .25X.25 (GAUZE/BANDAGES/DRESSINGS) IMPLANT
PATTIES SURGICAL .5 X.5 (GAUZE/BANDAGES/DRESSINGS) IMPLANT
PATTIES SURGICAL .5 X3 (DISPOSABLE) IMPLANT
PATTIES SURGICAL 1/4 X 3 (GAUZE/BANDAGES/DRESSINGS) IMPLANT
PATTIES SURGICAL 1X1 (DISPOSABLE) IMPLANT
PENCIL BUTTON HOLSTER BLD 10FT (ELECTRODE) IMPLANT
PIN MAYFIELD SKULL DISP (PIN) ×1 IMPLANT
PLATE CRANIAL 4H UNI NEURO III (Plate) IMPLANT
PLATE UNIV CMF 16 2H (Plate) IMPLANT
SCREW UNIII AXS SD 1.5X4 (Screw) IMPLANT
SOL ELECTROSURG ANTI STICK (MISCELLANEOUS)
SOLUTION ELECTROSURG ANTI STCK (MISCELLANEOUS) ×2 IMPLANT
SPECIMEN JAR SMALL (MISCELLANEOUS) IMPLANT
SPIKE FLUID TRANSFER (MISCELLANEOUS) ×1 IMPLANT
SPONGE NEURO XRAY DETECT 1X3 (DISPOSABLE) IMPLANT
SPONGE SURGIFOAM ABS GEL 100 (HEMOSTASIS) ×1 IMPLANT
STAPLER VISISTAT 35W (STAPLE) ×1 IMPLANT
STOCKINETTE 6 STRL (DRAPES) IMPLANT
SUT ETHILON 3 0 FSL (SUTURE) IMPLANT
SUT ETHILON 3 0 PS 1 (SUTURE) IMPLANT
SUT NURALON 4 0 TR CR/8 (SUTURE) ×3 IMPLANT
SUT SILK 0 TIES 10X30 (SUTURE) IMPLANT
SUT VIC AB 0 CT1 18XCR BRD8 (SUTURE) ×2 IMPLANT
SUT VIC AB 0 CT1 8-18 (SUTURE) ×2
SUT VIC AB 3-0 SH 8-18 (SUTURE) ×2 IMPLANT
TAPE CLOTH 1X10 TAN NS (GAUZE/BANDAGES/DRESSINGS) ×1 IMPLANT
TAPE PAPER 1X10 WHT MICROPORE (GAUZE/BANDAGES/DRESSINGS) IMPLANT
TIP TISSUE SONOPET IQ STD 12 (TIP) IMPLANT
TOWEL GREEN STERILE (TOWEL DISPOSABLE) ×1 IMPLANT
TOWEL GREEN STERILE FF (TOWEL DISPOSABLE) ×1 IMPLANT
TRAY FOLEY MTR SLVR 16FR STAT (SET/KITS/TRAYS/PACK) ×1 IMPLANT
TUBE CONNECTING 12X1/4 (SUCTIONS) ×1 IMPLANT
TUBING FEATHERFLOW (TUBING) IMPLANT
UNDERPAD 30X36 HEAVY ABSORB (UNDERPADS AND DIAPERS) ×1 IMPLANT
WATER STERILE IRR 1000ML POUR (IV SOLUTION) ×1 IMPLANT

## 2022-07-31 NOTE — Op Note (Signed)
NEUROSURGERY OPERATIVE NOTE   PREOP DIAGNOSIS:  Right temporal Tumor   POSTOP DIAGNOSIS: Same  PROCEDURE: Stereotactic right frontotemporal craniotomy for resection of extra-axial tumor Use of intraoperative microscope for microdissection  SURGEON: Dr. Lisbeth Renshaw, MD  ASSISTANT: Dr. Ervin Knack, MD  ANESTHESIA: General Endotracheal  EBL: 600cc  SPECIMENS: Right temporal tumor for permanent pathology  DRAINS: None  COMPLICATIONS: None immediate  CONDITION: Hemodynamically stable to PACU  HISTORY: Alexander Frazier is a 77 y.o. male initially seen in the outpatient neurosurgery clinic after he presented to his primary care doctor with headache and intermittent episodes of confusion.  MRI revealed a right temporal enhancing mass, suggestive of meningioma with significant associated brain edema.  Surgical resection was therefore indicated.  The risks, benefits, and alternatives to surgery as well as expected postoperative course and recovery were all reviewed in detail with the patient and his wife.  After all questions were answered informed consent was obtained and witnessed.  PROCEDURE IN DETAIL: The patient was brought to the operating room. After induction of general anesthesia, the patient was positioned on the operative table in the Mayfield head holder in the supine position. All pressure points were meticulously padded.  Preoperative stereotactic MRI scan was then Co. registered with surface markers until satisfactory accuracy was achieved.  Curvilinear skin incision was then marked out in order to allow access to the tumor including the temporal pole and the floor the middle fossa.  Skin incision was then marked out and prepped and draped in the usual sterile fashion.  After timeout was conducted, the incision was infiltrated with local anesthetic with epinephrine.  Incision was then made sharply and carried down through the galea.  Raney clips were applied.  The Bovie  electrocautery was then used to incise the periosteum and the temporalis muscle and fascia.  A single piece myocutaneous flap was then elevated and reflected anteriorly.  High-speed drill was used to create multiple bur holes and a standard frontotemporal flap was created with the craniotome.  After elevation of the bone flap, there was a brisk amount of bleeding from the floor of the right middle fossa, presumably from the middle meningeal artery.  Ultimately this was controlled with bipolar electrocautery.  The high-speed drill was then used to drill down the lesser wing of the sphenoid in order to allow unobstructed view to the optical carotid cistern.  The dura was then opened in curvilinear fashion and tacked up anteriorly.  The microscope was then draped sterilely and brought into the field and the remainder of the case was done under the microscope using microdissection technique.  Initially, a subfrontal approach was employed in order to identify the optic nerve.  Arachnoid overlying the optic nerve was then incised sharply with a arachnoid blade.  Arachnoid dissection was carried out both medially and laterally in order to open up the optical carotid cistern.  There was good egress of CSF which provided significant amount of brain relaxation.  Dissection was carried out further laterally and the tumor was easily identified abutting the lateral aspect of the carotid artery.  The medial sylvian fissure was split, and I was able to identify the ICA bifurcation as well as the proximal M1 segment.  This was then traced into the sylvian fissure.  A M2 branch was noted to be densely adherent to the medial aspect of the tumor.  This was carefully dissected away from the tumor sharply.  Once this was done I was able to place a cottonoid  between the lateral aspect of the carotid and MCA and its branches and the medial aspect of the tumor.  At this point attention was turned to the more lateral and anterior aspect  of the tumor.  The cortex overlying the right temporal pole was coagulated.  I then resected a portion of the right temporal pole.  This allowed access to the remainder of the lateral and anterior aspect of the tumor.  The bipolar electrocautery was used to dissect the tumor away from the surrounding temporal lobe.  Attention was then turned back to the medial aspect of the tumor.  Blood supply and attachment of the tumor appeared to be from the medial temporal dura and the lateral aspect of the cavernous sinus.  Bipolar electrocautery was used to carefully coagulate the attachment of the tumor to the dura and it was carefully peeled the.  I was then able to further dissect along the lateral aspect of the carotid artery and along the anterior aspect of the petrous clinoid fold.  Once this was done, I was able to complete the dissection of the tumor base along the lateral aspect of the cavernous sinus.  The majority of the tumor was then removed en bloc.  A small portion of tumor remained attached to the dura posteriorly.  This was again dissected from the temporal lobe with the bipolar, as well as its attachment to the dura.  Having resected the tumor, the resection cavity was inspected.  No further tumor was identified.  The tumor attachment to the dura was coagulated with the bipolar.  The wound was then irrigated with normal saline.  No active bleeding was identified.  The dura was then reapproximated with interrupted 4-0 Nurolon stitches.  A layer of Gelfoam was placed over the dural surface.  The bone flap was then replaced and plated with standard titanium plates and screws.  The wound was again irrigated.  The temporalis muscle and fascia was reapproximated with interrupted 0 Vicryl stitches.  The galea was reapproximated with interrupted 3-0 and 0 Vicryl stitches and the skin was closed with staples.  Patient was then removed from the Mayfield head holder.  A sterile dressing and head wrap was  applied.  At the end of the case all sponge, needle, instrument, and cottonoid counts were correct.  The patient was then extubated and taken to the postanesthesia care unit in stable hemodynamic condition.    Lisbeth Renshaw, MD [facility]

## 2022-07-31 NOTE — Transfer of Care (Signed)
Immediate Anesthesia Transfer of Care Note  Patient: Alexander Frazier  Procedure(s) Performed: PTERIONAL CRANIOTOMY FOR RESECTION OF MENINGIOMA (Right) APPLICATION OF CRANIAL NAVIGATION (Right)  Patient Location: PACU  Anesthesia Type:General  Level of Consciousness: awake, alert , and oriented  Airway & Oxygen Therapy: Patient Spontanous Breathing and Patient connected to face mask oxygen  Post-op Assessment: Report given to RN and Post -op Vital signs reviewed and stable  Post vital signs: Reviewed and stable  Last Vitals:  Vitals Value Taken Time  BP 166/77   Temp    Pulse    Resp    SpO2 99     Last Pain:  Vitals:   07/31/22 1042  TempSrc:   PainSc: 0-No pain      Patients Stated Pain Goal: 0 (07/31/22 1042)  Complications: No notable events documented.

## 2022-07-31 NOTE — Anesthesia Procedure Notes (Signed)
Procedure Name: Intubation Date/Time: 07/31/2022 12:02 PM  Performed by: Mayer Camel, CRNAPre-anesthesia Checklist: Patient identified, Emergency Drugs available, Suction available and Patient being monitored Patient Re-evaluated:Patient Re-evaluated prior to induction Oxygen Delivery Method: Circle System Utilized Preoxygenation: Pre-oxygenation with 100% oxygen Induction Type: IV induction Ventilation: Mask ventilation without difficulty Laryngoscope Size: Miller and 2 Grade View: Grade I Tube type: Oral Tube size: 7.5 mm Number of attempts: 1 Airway Equipment and Method: Stylet and Oral airway Placement Confirmation: ETT inserted through vocal cords under direct vision, positive ETCO2 and breath sounds checked- equal and bilateral Secured at: 22 cm Tube secured with: Tape Dental Injury: Teeth and Oropharynx as per pre-operative assessment

## 2022-07-31 NOTE — Anesthesia Postprocedure Evaluation (Signed)
Anesthesia Post Note  Patient: Alexander Frazier  Procedure(s) Performed: PTERIONAL CRANIOTOMY FOR RESECTION OF MENINGIOMA (Right) APPLICATION OF CRANIAL NAVIGATION (Right)     Patient location during evaluation: PACU Anesthesia Type: General Level of consciousness: awake and alert Pain management: pain level controlled Vital Signs Assessment: post-procedure vital signs reviewed and stable Respiratory status: spontaneous breathing, nonlabored ventilation and respiratory function stable Cardiovascular status: blood pressure returned to baseline and stable Postop Assessment: no apparent nausea or vomiting Anesthetic complications: no   No notable events documented.  Last Vitals:  Vitals:   07/31/22 1645 07/31/22 1711  BP: 126/70 139/79  Pulse: 81 84  Resp: 17 13  Temp: 36.8 C 36.9 C  SpO2: 95% 99%    Last Pain:  Vitals:   07/31/22 1711  TempSrc: Oral  PainSc:    Pain Goal: Patients Stated Pain Goal: 0 (07/31/22 1042)  LLE Motor Response: Purposeful movement, Responds to commands (07/31/22 1645) LLE Sensation: Full sensation (07/31/22 1645) RLE Motor Response: Purposeful movement, Responds to commands (07/31/22 1645) RLE Sensation: Full sensation (07/31/22 1645)        Lowella Curb

## 2022-07-31 NOTE — H&P (Signed)
Chief Complaint   Meningioma  History of Present Illness  Mr. Alexander Frazier is a 77 year old man seen for initial consultation at the request of Dr. Evlyn Kanner.  He is referred for recent discovery of right-sided meningioma.  Briefly, he and his wife report consult of symptoms about a year ago in which his wife noted occasional episodes of confusion and "staring spells."  These happen periodically, including one over the holidays.  In fact, he had another episode about a month ago which also involved some left-sided facial droop.  She reports that these episodes last for no more than a few minutes with return to normal.  Patient does not report any significant headaches, changes in his vision, or numbness tingling or weakness of the extremities.  No difficulty walking.  No incoordination.  Of note, the patient has a history of mild medically controlled hypertension.  No history of diabetes.  No previous heart attack or stroke.  He has a history of basal cell skin cancers, no other oncologic history.  He is not on any blood thinners or antiplatelet agents with the exception of a baby aspirin.  He is a nonsmoker.  Past Medical History   Past Medical History:  Diagnosis Date   BPH (benign prostatic hyperplasia)    CAD (coronary artery disease)    Diastolic dysfunction    Diverticulosis large intestine w/o perforation or abscess w/o bleeding    Forgetfulness    History of kidney stones    Patient reports 10 plus years   Hyperlipidemia    Hypertension    Skin cancer    Thoracic aortic aneurysm [facility])     Past Surgical History   Past Surgical History:  Procedure Laterality Date   APPENDECTOMY     CATARACT EXTRACTION Bilateral 2018   COLON SURGERY     pateint reports "colonoscopy over 6 years"   HERNIA REPAIR     MOHS SURGERY     VASECTOMY      Social History   Social History   Tobacco Use   Smoking status: Never   Smokeless tobacco: Never  Vaping Use   Vaping Use: Never used  Substance  Use Topics   Alcohol use: Not Currently   Drug use: Never    Medications   Prior to Admission medications   Medication Sig Start Date End Date Taking? Authorizing Provider  enalapril (VASOTEC) 10 MG tablet Take 1 tablet (10 mg total) by mouth daily. 05/15/22  Yes Antonieta Iba, MD  ezetimibe (ZETIA) 10 MG tablet Take 1 tablet (10 mg total) by mouth daily. 05/15/22  Yes Antonieta Iba, MD  Multiple Vitamins-Minerals (DAILY MULTIVITAMIN) CAPS Take 1 tablet by mouth daily. 12/03/16  Yes [provider]  naproxen sodium (ALEVE) 220 MG tablet Take 220 mg by mouth daily as needed (pain).   Yes [provider]  Polyethyl Glycol-Propyl Glycol (SYSTANE OP) Place 1 drop into both eyes daily as needed (irritation).   Yes [provider]  simvastatin (ZOCOR) 20 MG tablet Take 1 tablet (20 mg total) by mouth daily. 05/15/22  Yes Antonieta Iba, MD  aspirin 81 MG EC tablet Take 81 mg by mouth daily. 12/03/16   [provider]    Allergies  No Known Allergies  Review of Systems  ROS  Neurologic Exam  Awake, alert, oriented Memory and concentration grossly intact Speech fluent, appropriate CN grossly intact Motor exam: Upper Extremities Deltoid Bicep Tricep Grip  Right 5/5 5/5 5/5 5/5  Left 5/5 5/5  5/5 5/5   Lower Extremities IP Quad PF DF EHL  Right 5/5 5/5 5/5 5/5 5/5  Left 5/5 5/5 5/5 5/5 5/5   Sensation grossly intact to LT  Imaging  MRI of the brain with without contrast dated 07/19/2022 was personally reviewed.  This demonstrates a partially calcified enhancing mass arising from the medial right temporal dura/lateral cavernous sinus.  There is local mass effect upon the right temporal lobe.  There is also significant associated right temporoparietal brain edema.  Impression  - 77 y.o. male with large right temporal meningioma and associated brain edema which requires resection  Plan  - Will proceed with right temporal crani for resection of  tumor   I have reviewed the indications for the procedure as well as the details of the procedure and the expected postoperative course and recovery at length with the patient in the office. We have also reviewed in detail the risks, benefits, and alternatives to the procedure. All questions were answered and Trevel Dillenbeck provided informed consent to proceed.  Lisbeth Renshaw, MD [facility]

## 2022-07-31 NOTE — Anesthesia Procedure Notes (Signed)
Arterial Line Insertion Start/End6/25/2024 11:00 AM Performed by: Mayer Camel, CRNA, CRNA  Patient location: Pre-op. Preanesthetic checklist: patient identified, IV checked, site marked, risks and benefits discussed, surgical consent, monitors and equipment checked, pre-op evaluation, timeout performed and anesthesia consent Lidocaine 1% used for infiltration Left, radial was placed Catheter size: 20 G Hand hygiene performed  and maximum sterile barriers used   Attempts: 2 Procedure performed without using ultrasound guided technique. Ultrasound Notes:anatomy identified, needle tip was noted to be adjacent to the nerve/plexus identified and no ultrasound evidence of intravascular and/or intraneural injection Following insertion, dressing applied and Biopatch. Post procedure assessment: normal and unchanged  Patient tolerated the procedure well with no immediate complications.

## 2022-08-01 ENCOUNTER — Inpatient Hospital Stay (HOSPITAL_COMMUNITY): Payer: Medicare Other

## 2022-08-01 LAB — POCT I-STAT 7, (LYTES, BLD GAS, ICA,H+H)
Acid-base deficit: 3 mmol/L — ABNORMAL HIGH (ref 0.0–2.0)
Bicarbonate: 22.1 mmol/L (ref 20.0–28.0)
Calcium, Ion: 1.13 mmol/L — ABNORMAL LOW (ref 1.15–1.40)
HCT: 34 % — ABNORMAL LOW (ref 39.0–52.0)
Hemoglobin: 11.6 g/dL — ABNORMAL LOW (ref 13.0–17.0)
O2 Saturation: 100 %
Patient temperature: 35.9
Potassium: 4 mmol/L (ref 3.5–5.1)
Sodium: 140 mmol/L (ref 135–145)
TCO2: 23 mmol/L (ref 22–32)
pCO2 arterial: 38.8 mmHg (ref 32–48)
pH, Arterial: 7.36 (ref 7.35–7.45)
pO2, Arterial: 188 mmHg — ABNORMAL HIGH (ref 83–108)

## 2022-08-01 MED ORDER — CHLORHEXIDINE GLUCONATE CLOTH 2 % EX PADS
6.0000 | MEDICATED_PAD | Freq: Every day | CUTANEOUS | Status: DC
  Administered 2022-08-01: 6 via TOPICAL

## 2022-08-01 MED ORDER — GADOBUTROL 1 MMOL/ML IV SOLN
8.0000 mL | Freq: Once | INTRAVENOUS | Status: AC | PRN
  Administered 2022-08-01: 8 mL via INTRAVENOUS

## 2022-08-01 MED ORDER — CHLORPROMAZINE HCL 25 MG PO TABS
25.0000 mg | ORAL_TABLET | Freq: Three times a day (TID) | ORAL | Status: DC | PRN
  Administered 2022-08-01: 25 mg via ORAL
  Filled 2022-08-01 (×2): qty 1

## 2022-08-01 NOTE — Addendum Note (Signed)
Addendum  created 08/01/22 0859 by Mayer Camel, CRNA   Intraprocedure Event edited, Intraprocedure Meds edited

## 2022-08-01 NOTE — TOC CM/SW Note (Signed)
Transition of Care [facility]) - Inpatient Brief Assessment   Patient Details  Name: Alexander Frazier MRN: [id number] Date of Birth: Aug 10, 1945  [facility]) CM/SW Contact:    Glennon Mac, RN Phone Number: 08/01/2022, 3:58 PM   Clinical Narrative: Patient admitted on 07/31/22 s/p craniotomy for resection of extra-axial tumor.  PTA, pt independent and living at home with spouse. TOC will continue to follow as patient progresses.    Transition of Care Asessment: Insurance and Status: Insurance coverage has been reviewed Patient has primary care physician: Yes Jeannett Senior [place]) Home environment has been reviewed: Independent prior to admission Prior level of function:: Lives with spouse Prior/Current Home Services: No current home services Social Determinants of Health Reivew: SDOH reviewed no interventions necessary Readmission risk has been reviewed: Yes Transition of care needs: no transition of care needs at this time  Quintella Baton, RN, BSN  Trauma/Neuro ICU Case Manager [phone]

## 2022-08-01 NOTE — Progress Notes (Signed)
  NEUROSURGERY PROGRESS NOTE   Pt seen and examined. No issues overnight. Minimal HA currently. No new c/o N/T/W or visual changes.  EXAM: Temp:  [97.9 F (36.6 C)-99 F (37.2 C)] 99 F (37.2 C) (06/26 0743) Pulse Rate:  [73-89] 84 (06/26 0900) Resp:  [5-23] 18 (06/26 0900) BP: (104-146)/(54-81) 108/60 (06/26 0900) SpO2:  [93 %-100 %] 94 % (06/26 0900) Arterial Line BP: (149-182)/(55-68) 178/66 (06/25 1935) Weight:  [80.2 kg] 80.2 kg (06/25 1023) Intake/Output      06/25 0701 06/26 0700 06/26 0701 06/27 0700   I.V. (mL/kg) 1724.3 (21.5)    IV Piggyback 600    Total Intake(mL/kg) 2324.3 (29)    Urine (mL/kg/hr) 1945    Blood 600    Total Output 2545    Net -220.7          Awake, alert, oriented Speech fluent CN intact, EOMI 5/5  BUE/BLE Headwrap in place, c/d/i  LABS: Lab Results  Component Value Date   CREATININE 0.91 07/30/2022   BUN 17 07/30/2022   NA 137 07/30/2022   K 4.0 07/30/2022   CL 103 07/30/2022   CO2 26 07/30/2022   Lab Results  Component Value Date   WBC 7.5 07/30/2022   HGB 14.9 07/30/2022   HCT 44.0 07/30/2022   MCV 92.2 07/30/2022   PLT 242 07/30/2022    IMAGING: MRI w/w/o reviewed and does not demonstrate any residual tumor. Stable surrounding edema and mild R->L MLS. No HCP. Some diffusion restriction in the anterior right temporal region surrounding operative corridor, likely postoperative.  IMPRESSION: - 77 y.o. male POD#1 right FT crani for resection of meningioma, doing well  PLAN: - Mobilize today - Cont keppra and decadron - d/c foley   Lisbeth Renshaw, MD [facility]

## 2022-08-02 ENCOUNTER — Encounter (HOSPITAL_COMMUNITY): Payer: Self-pay | Admitting: Neurosurgery

## 2022-08-02 LAB — SURGICAL PATHOLOGY

## 2022-08-02 MED ORDER — METHYLPREDNISOLONE 4 MG PO TBPK: ORAL_TABLET | ORAL | 0 refills | Status: DC

## 2022-08-02 MED ORDER — ASPIRIN 81 MG PO TBEC: 81.0000 mg | DELAYED_RELEASE_TABLET | Freq: Every day | ORAL | 12 refills | Status: AC

## 2022-08-02 MED ORDER — HYDROCODONE-ACETAMINOPHEN 5-325 MG PO TABS: 1.0000 | ORAL_TABLET | Freq: Four times a day (QID) | ORAL | 0 refills | Status: AC | PRN

## 2022-08-02 NOTE — Evaluation (Signed)
Physical Therapy Evaluation Patient Details Name: Alexander Frazier MRN: [id number] DOB: 27-Nov-1945 Today's Date: 08/02/2022  History of Present Illness  The pt is a 77 yo male presenting 6/25 for surgical resection of R temporal meningioma. PMH includes: CAD, diverticulosis, HLD, HTN, and skin cancer.   Clinical Impression  Pt in bed upon arrival of PT, agreeable to evaluation at this time. Prior to admission the pt was independent with all mobility, reports he was golfing once/week and not using DME or any history of falls. The pt presents today after surgery with minor deficits in stability, but was able to complete ambulation both with and without challenges as well as stair navigation without need for assist or DME. The pt's VSS remained stable and had no change in headache with mobility. Discussed increased supervision for stairs at home initially, but pt able to complete without physical assist. Will continue to follow acutely during hospitalization to progress balance and endurance, but pt is safe to return home with family when medically stable for d/c.   Dynamic Gait Index (DGI): 22/24 (<19 indicates increased risk for falls)    Recommendations for follow up therapy are one component of a multi-disciplinary discharge planning process, led by the attending physician.  Recommendations may be updated based on patient status, additional functional criteria and insurance authorization.  Follow Up Recommendations       Assistance Recommended at Discharge PRN  Patient can return home with the following  Help with stairs or ramp for entrance    Equipment Recommendations None recommended by PT  Recommendations for Other Services       Functional Status Assessment Patient has had a recent decline in their functional status and demonstrates the ability to make significant improvements in function in a reasonable and predictable amount of time.     Precautions / Restrictions  Precautions Precautions: None Restrictions Weight Bearing Restrictions: No      Mobility  Bed Mobility Overal bed mobility: Needs Assistance Bed Mobility: Supine to Sit     Supine to sit: Supervision     General bed mobility comments: no assist other than line management    Transfers Overall transfer level: Needs assistance Equipment used: None Transfers: Sit to/from Stand Sit to Stand: Supervision           General transfer comment: supervision for safety and line management, no assist given    Ambulation/Gait Ambulation/Gait assistance: Supervision Gait Distance (Feet): 300 Feet Assistive device: None Gait Pattern/deviations: WFL(Within Functional Limits)   Gait velocity interpretation: 1.31 - 2.62 ft/sec, indicative of limited community ambulator   General Gait Details: stable even with balance challenges, slightly slowed but WFL  Stairs Stairs: Yes Stairs assistance: Min guard Stair Management: One rail Left, Alternating pattern, Forwards Number of Stairs: 6       Balance Overall balance assessment: Mild deficits observed, not formally tested                               Standardized Balance Assessment Standardized Balance Assessment : Dynamic Gait Index   Dynamic Gait Index Level Surface: Normal Change in Gait Speed: Normal Gait with Horizontal Head Turns: Normal Gait with Vertical Head Turns: Normal Gait and Pivot Turn: Normal Step Over Obstacle: Mild Impairment Step Around Obstacles: Normal Steps: Mild Impairment Total Score: 22       Pertinent Vitals/Pain Pain Assessment Pain Assessment: Faces Faces Pain Scale: Hurts a little bit Pain Location: incision Pain  Descriptors / Indicators: Dull Pain Intervention(s): Limited activity within patient's tolerance, Monitored during session, Repositioned    Home Living Family/patient expects to be discharged to:: Private residence Living Arrangements: Spouse/significant  other Available Help at Discharge: Family;Available 24 hours/day Type of Home: House Home Access: Stairs to enter Entrance Stairs-Rails: None Entrance Stairs-Number of Steps: 1 Alternate Level Stairs-Number of Steps: flight Home Layout: Two level;Able to live on main level with bedroom/bathroom Home Equipment: None      Prior Function Prior Level of Function : Independent/Modified Independent;Driving             Mobility Comments: pt enjoys playing golf, no falls, no DME ADLs Comments: independent     Hand Dominance   Dominant Hand: Right    Extremity/Trunk Assessment   Upper Extremity Assessment Upper Extremity Assessment: Overall WFL for tasks assessed    Lower Extremity Assessment Lower Extremity Assessment: Overall WFL for tasks assessed    Cervical / Trunk Assessment Cervical / Trunk Assessment: Normal  Communication   Communication: No difficulties  Cognition Arousal/Alertness: Awake/alert Behavior During Therapy: WFL for tasks assessed/performed Overall Cognitive Status: Within Functional Limits for tasks assessed                                                 Assessment/Plan    PT Assessment Patient needs continued PT services  PT Problem List Decreased balance       PT Treatment Interventions Stair training;Gait training;Therapeutic exercise;Balance training    PT Goals (Current goals can be found in the Care Plan section)  Acute Rehab PT Goals Patient Stated Goal: return to playing golf PT Goal Formulation: With patient/family Time For Goal Achievement: 08/16/22 Potential to Achieve Goals: Good    Frequency Min 3X/week        AM-PAC PT "6 Clicks" Mobility  Outcome Measure Help needed turning from your back to your side while in a flat bed without using bedrails?: None Help needed moving from lying on your back to sitting on the side of a flat bed without using bedrails?: None Help needed moving to and from a bed to  a chair (including a wheelchair)?: A Little Help needed standing up from a chair using your arms (e.g., wheelchair or bedside chair)?: A Little Help needed to walk in hospital room?: A Little Help needed climbing 3-5 steps with a railing? : A Little 6 Click Score: 20    End of Session Equipment Utilized During Treatment: Gait belt Activity Tolerance: Patient tolerated treatment well Patient left: in chair;with call bell/phone within reach;with family/visitor present Nurse Communication: Mobility status PT Visit Diagnosis: Other abnormalities of gait and mobility (R26.89);Unsteadiness on feet (R26.81)    Time: 1610-9604 PT Time Calculation (min) (ACUTE ONLY): 31 min   Charges:   PT Evaluation $PT Eval Low Complexity: 1 Low PT Treatments $Gait Training: 8-22 mins        Vickki Muff, PT, DPT   Acute Rehabilitation Department Office [phone] Secure Chat Communication Preferred  Ronnie Derby 08/02/2022, 11:31 AM

## 2022-08-02 NOTE — Evaluation (Signed)
Occupational Therapy Evaluation Patient Details Name: Alexander Frazier MRN: [id number] DOB: 11-02-1945 Today's Date: 08/02/2022   History of Present Illness The pt is a 77 yo male presenting 6/25 for surgical resection of R temporal meningioma. PMH includes: CAD, diverticulosis, HLD, HTN, and skin cancer.   Clinical Impression   PTA pt lives independently with his wife and enjoys playing golf. Pt scored WNL on the Short Blessed Assessment and clock draw test. Discussed initial S with medication and financial management. Pt verbalized understanding.  No further OT needs.      Recommendations for follow up therapy are one component of a multi-disciplinary discharge planning process, led by the attending physician.  Recommendations may be updated based on patient status, additional functional criteria and insurance authorization.   Assistance Recommended at Discharge Intermittent Supervision/Assistance  Patient can return home with the following Direct supervision/assist for medications management;Direct supervision/assist for financial management;Assist for transportation    Functional Status Assessment  Patient has not had a recent decline in their functional status  Equipment Recommendations  None recommended by OT    Recommendations for Other Services       Precautions / Restrictions Precautions Precautions: None Restrictions Weight Bearing Restrictions: No      Mobility Bed Mobility               General bed mobility comments: OOB in chair    Transfers Overall transfer level: Modified independent                        Balance Overall balance assessment: Mild deficits observed, not formally tested                                         ADL either performed or assessed with clinical judgement   ADL Overall ADL's : At baseline                                             Vision Baseline Vision/History: 1 Wears  glasses (reading; no changes)       Perception Perception Perception Tested?: Yes Comments: no deficits noted   Praxis      Pertinent Vitals/Pain Pain Assessment Pain Assessment: Faces Faces Pain Scale: Hurts a little bit Pain Location: incision Pain Descriptors / Indicators: Dull Pain Intervention(s): Limited activity within patient's tolerance     Hand Dominance Right   Extremity/Trunk Assessment Upper Extremity Assessment Upper Extremity Assessment: Overall WFL for tasks assessed   Lower Extremity Assessment Lower Extremity Assessment: Defer to PT evaluation   Cervical / Trunk Assessment Cervical / Trunk Assessment: Normal   Communication Communication Communication: No difficulties   Cognition Arousal/Alertness: Awake/alert Behavior During Therapy: WFL for tasks assessed/performed Overall Cognitive Status: Within Functional Limits for tasks assessed                                 General Comments: Assessed wtih the Clock draw adn Short Blessed TEst. Scored a 2/28 on the Short Blessed, which places him in the "normal" category;  no deficits on the clock draw     General Comments       Exercises     Shoulder Instructions  Home Living Family/patient expects to be discharged to:: Private residence Living Arrangements: Spouse/significant other Available Help at Discharge: Family;Available 24 hours/day Type of Home: House Home Access: Stairs to enter [employer] of Steps: 1 Entrance Stairs-Rails: None Home Layout: Two level;Able to live on main level with bedroom/bathroom Alternate Level Stairs-Number of Steps: flight Alternate Level Stairs-Rails: Left Bathroom Shower/Tub: Producer, television/film/video: Standard Bathroom Accessibility: Yes How Accessible: Accessible via walker Home Equipment: Grab bars - tub/shower          Prior Functioning/Environment Prior Level of Function : Independent/Modified  Independent;Driving             Mobility Comments: pt enjoys playing golf, no falls, no DME ADLs Comments: independent        OT Problem List: Pain      OT Treatment/Interventions:      OT Goals(Current goals can be found in the care plan section) Acute Rehab OT Goals Patient Stated Goal: to resume golf and activities OT Goal Formulation: All assessment and education complete, DC therapy  OT Frequency:      Co-evaluation              AM-PAC OT "6 Clicks" Daily Activity     Outcome Measure Help from another person eating meals?: None Help from another person taking care of personal grooming?: None Help from another person toileting, which includes using toliet, bedpan, or urinal?: None Help from another person bathing (including washing, rinsing, drying)?: None Help from another person to put on and taking off regular upper body clothing?: None Help from another person to put on and taking off regular lower body clothing?: None 6 Click Score: 24   End of Session Nurse Communication: Mobility status  Activity Tolerance: Patient tolerated treatment well Patient left: in chair;with call bell/phone within reach  OT Visit Diagnosis: Unsteadiness on feet (R26.81)                Time: 1128-1150 OT Time Calculation (min): 22 min Charges:  OT General Charges $OT Visit: 1 Visit OT Evaluation $OT Eval Moderate Complexity: 1 Mod  Odessa Morren, OT/L   Acute OT Clinical Specialist Acute Rehabilitation Services Pager [phone] Office [phone]   [facility] 08/02/2022, 11:55 AM

## 2022-08-02 NOTE — Progress Notes (Signed)
This RN went over the discharge instructions with patient, his wife, and daughter. The patient verbalized understanding. Patient to be discharged to private home via private vehicle driven by family. Follow up appt with Neuro surg is in place with patient in agreement.   This RN removed the pts PIV and dressing to head. Surgical site to right side of the head is closed with staples and has good coloration, no warmth, no current pain, no drainage. Minimal edema around surgical site; neurosurg aware of edema.  Pt was taken to private vehicle via wheelchair.

## 2022-08-02 NOTE — Progress Notes (Signed)
  NEUROSURGERY PROGRESS NOTE   Pt seen and examined. No issues overnight. Minimal HA. Walked with PT including stairs without difficulty.  EXAM: Temp:  [97.8 F (36.6 C)-99 F (37.2 C)] 97.8 F (36.6 C) (06/27 0759) Pulse Rate:  [68-101] 82 (06/27 0800) Resp:  [11-25] 15 (06/27 0800) BP: (91-142)/(46-79) 129/71 (06/27 0902) SpO2:  [90 %-99 %] 95 % (06/27 0800) Intake/Output      06/26 0701 06/27 0700 06/27 0701 06/28 0700   I.V. (mL/kg) 1039.7 (13)    IV Piggyback 200 100   Total Intake(mL/kg) 1239.7 (15.5) 100 (1.2)   Urine (mL/kg/hr) 1475 (0.8)    Blood     Total Output 1475    Net -235.3 +100        Urine Occurrence 3 x     Awake, alert, oriented Speech fluent CN intact, EOMI 5/5  BUE/BLE dressing in place, c/d/i  LABS: Lab Results  Component Value Date   CREATININE 0.91 07/30/2022   BUN 17 07/30/2022   NA 140 07/31/2022   K 4.0 07/31/2022   CL 103 07/30/2022   CO2 26 07/30/2022   Lab Results  Component Value Date   WBC 7.5 07/30/2022   HGB 11.6 (L) 07/31/2022   HCT 34.0 (L) 07/31/2022   MCV 92.2 07/30/2022   PLT 242 07/30/2022    IMPRESSION: - 77 y.o. male POD#2 right FT crani for resection of meningioma, doing well  PLAN: - d/c home today   Lisbeth Renshaw, MD [facility]

## 2022-08-02 NOTE — Discharge Summary (Signed)
Physician Discharge Summary  Patient ID: Alexander Frazier MRN: [id number] DOB/AGE: 06/11/1945 77 y.o.  Admit date: 07/31/2022 Discharge date: 08/02/2022  Admission Diagnoses:  Meningioma  Discharge Diagnoses:  Same Principal Problem:   Status post craniotomy Active Problems:   Meningioma [facility])   Discharged Condition: Stable  Hospital Course:  Alexander Frazier is a 77 y.o. male admitted after elective uncomplicated right craniotomy for resection of meningioma. He was at baseline postop, ambulating well, tolerating diet, headache under good control. He was monitored in ICU and requested d/c on POD#2.  Treatments: Surgery - Right crani for resection of tumor  Discharge Exam: Blood pressure 114/69, pulse 76, temperature 97.8 F (36.6 C), temperature source Oral, resp. rate (!) 23, height 5\' 11"  (1.803 m), weight 80.2 kg, SpO2 98 %. Awake, alert, oriented Speech fluent, appropriate CN grossly intact 5/5 BUE/BLE Wound c/d/i  Disposition: Discharge disposition: 01-Home or Self Care       Discharge Instructions     Call MD for:  redness, tenderness, or signs of infection (pain, swelling, redness, odor or green/yellow discharge around incision site)   Complete by: As directed    Call MD for:  temperature >100.4   Complete by: As directed    Diet - low sodium heart healthy   Complete by: As directed    Discharge instructions   Complete by: As directed    Walk at home as much as possible, at least 4 times / day   Increase activity slowly   Complete by: As directed    Lifting restrictions   Complete by: As directed    No lifting > 10 lbs   May shower / Bathe   Complete by: As directed    48 hours after surgery   May walk up steps   Complete by: As directed    No dressing needed   Complete by: As directed    Other Restrictions   Complete by: As directed    No bending/twisting at waist      Allergies as of 08/02/2022   No Known Allergies      Medication List      TAKE these medications    aspirin EC 81 MG tablet Take 1 tablet (81 mg total) by mouth daily. Start taking on: August 06, 2022 What changed: These instructions start on August 06, 2022. If you are unsure what to do until then, ask your doctor or other care provider.   Daily Multivitamin Caps Take 1 tablet by mouth daily.   enalapril 10 MG tablet Commonly known as: VASOTEC Take 1 tablet (10 mg total) by mouth daily.   ezetimibe 10 MG tablet Commonly known as: ZETIA Take 1 tablet (10 mg total) by mouth daily.   HYDROcodone-acetaminophen 5-325 MG tablet Commonly known as: NORCO/VICODIN Take 1 tablet by mouth every 6 (six) hours as needed for up to 5 days for moderate pain.   methylPREDNISolone 4 MG Tbpk tablet Commonly known as: MEDROL DOSEPAK Take as directed on package   naproxen sodium 220 MG tablet Commonly known as: ALEVE Take 220 mg by mouth daily as needed (pain).   simvastatin 20 MG tablet Commonly known as: ZOCOR Take 1 tablet (20 mg total) by mouth daily.   SYSTANE OP Place 1 drop into both eyes daily as needed (irritation).               Discharge Care Instructions  (From admission, onward)           Start  Ordered   08/02/22 0000  No dressing needed        08/02/22 1103            Follow-up Information     Lisbeth Renshaw, MD Follow up.   Specialty: Neurosurgery Contact information: [address] [place] [address] [phone]                 Signed: Jackelyn Hoehn 08/02/2022, 11:03 AM

## 2022-10-15 DIAGNOSIS — Z23 Encounter for immunization: Secondary | ICD-10-CM | POA: Diagnosis not present

## 2022-10-24 ENCOUNTER — Ambulatory Visit: Payer: Medicare Other

## 2022-10-24 DIAGNOSIS — Z23 Encounter for immunization: Secondary | ICD-10-CM | POA: Diagnosis not present

## 2022-12-13 DIAGNOSIS — C4490 Unspecified malignant neoplasm of skin, unspecified: Secondary | ICD-10-CM | POA: Diagnosis not present

## 2022-12-13 DIAGNOSIS — I2510 Atherosclerotic heart disease of native coronary artery without angina pectoris: Secondary | ICD-10-CM | POA: Diagnosis not present

## 2022-12-13 DIAGNOSIS — D320 Benign neoplasm of cerebral meninges: Secondary | ICD-10-CM | POA: Diagnosis not present

## 2022-12-13 DIAGNOSIS — G3184 Mild cognitive impairment, so stated: Secondary | ICD-10-CM | POA: Diagnosis not present

## 2022-12-13 DIAGNOSIS — I7121 Aneurysm of the ascending aorta, without rupture: Secondary | ICD-10-CM | POA: Diagnosis not present

## 2022-12-13 DIAGNOSIS — R7301 Impaired fasting glucose: Secondary | ICD-10-CM | POA: Diagnosis not present

## 2022-12-13 DIAGNOSIS — E785 Hyperlipidemia, unspecified: Secondary | ICD-10-CM | POA: Diagnosis not present

## 2022-12-13 DIAGNOSIS — D126 Benign neoplasm of colon, unspecified: Secondary | ICD-10-CM | POA: Diagnosis not present

## 2022-12-13 DIAGNOSIS — I700 Atherosclerosis of aorta: Secondary | ICD-10-CM | POA: Diagnosis not present

## 2022-12-13 DIAGNOSIS — K5730 Diverticulosis of large intestine without perforation or abscess without bleeding: Secondary | ICD-10-CM | POA: Diagnosis not present

## 2022-12-13 DIAGNOSIS — N401 Enlarged prostate with lower urinary tract symptoms: Secondary | ICD-10-CM | POA: Diagnosis not present

## 2022-12-13 DIAGNOSIS — I10 Essential (primary) hypertension: Secondary | ICD-10-CM | POA: Diagnosis not present

## 2022-12-27 ENCOUNTER — Other Ambulatory Visit: Payer: Self-pay | Admitting: Neurosurgery

## 2022-12-27 DIAGNOSIS — D329 Benign neoplasm of meninges, unspecified: Secondary | ICD-10-CM

## 2022-12-28 DIAGNOSIS — H26493 Other secondary cataract, bilateral: Secondary | ICD-10-CM | POA: Diagnosis not present

## 2023-01-22 ENCOUNTER — Ambulatory Visit
Admission: RE | Admit: 2023-01-22 | Discharge: 2023-01-22 | Disposition: A | Discharge status: Home / Self Care | Payer: Medicare Other | Source: Ambulatory Visit | Attending: Neurosurgery | Admitting: Neurosurgery

## 2023-01-22 DIAGNOSIS — D320 Benign neoplasm of cerebral meninges: Secondary | ICD-10-CM | POA: Diagnosis not present

## 2023-01-22 DIAGNOSIS — Z9889 Other specified postprocedural states: Secondary | ICD-10-CM | POA: Diagnosis not present

## 2023-01-22 DIAGNOSIS — D329 Benign neoplasm of meninges, unspecified: Secondary | ICD-10-CM

## 2023-01-22 MED ORDER — GADOPICLENOL 0.5 MMOL/ML IV SOLN
10.0000 mL | Freq: Once | INTRAVENOUS | Status: AC | PRN
  Administered 2023-01-22: 10 mL via INTRAVENOUS

## 2023-02-27 DIAGNOSIS — D329 Benign neoplasm of meninges, unspecified: Secondary | ICD-10-CM | POA: Diagnosis not present

## 2023-04-29 DIAGNOSIS — D1801 Hemangioma of skin and subcutaneous tissue: Secondary | ICD-10-CM | POA: Diagnosis not present

## 2023-04-29 DIAGNOSIS — L821 Other seborrheic keratosis: Secondary | ICD-10-CM | POA: Diagnosis not present

## 2023-04-29 DIAGNOSIS — Z85828 Personal history of other malignant neoplasm of skin: Secondary | ICD-10-CM | POA: Diagnosis not present

## 2023-05-23 ENCOUNTER — Other Ambulatory Visit: Payer: Self-pay | Admitting: Cardiovascular Disease

## 2023-06-10 DIAGNOSIS — R7301 Impaired fasting glucose: Secondary | ICD-10-CM | POA: Diagnosis not present

## 2023-06-10 DIAGNOSIS — N401 Enlarged prostate with lower urinary tract symptoms: Secondary | ICD-10-CM | POA: Diagnosis not present

## 2023-06-10 DIAGNOSIS — I5189 Other ill-defined heart diseases: Secondary | ICD-10-CM | POA: Diagnosis not present

## 2023-06-10 DIAGNOSIS — I2510 Atherosclerotic heart disease of native coronary artery without angina pectoris: Secondary | ICD-10-CM | POA: Diagnosis not present

## 2023-06-10 DIAGNOSIS — E785 Hyperlipidemia, unspecified: Secondary | ICD-10-CM | POA: Diagnosis not present

## 2023-06-10 DIAGNOSIS — I119 Hypertensive heart disease without heart failure: Secondary | ICD-10-CM | POA: Diagnosis not present

## 2023-06-17 DIAGNOSIS — E785 Hyperlipidemia, unspecified: Secondary | ICD-10-CM | POA: Diagnosis not present

## 2023-06-17 DIAGNOSIS — D320 Benign neoplasm of cerebral meninges: Secondary | ICD-10-CM | POA: Diagnosis not present

## 2023-06-17 DIAGNOSIS — I7121 Aneurysm of the ascending aorta, without rupture: Secondary | ICD-10-CM | POA: Diagnosis not present

## 2023-06-17 DIAGNOSIS — I2510 Atherosclerotic heart disease of native coronary artery without angina pectoris: Secondary | ICD-10-CM | POA: Diagnosis not present

## 2023-06-17 DIAGNOSIS — K5730 Diverticulosis of large intestine without perforation or abscess without bleeding: Secondary | ICD-10-CM | POA: Diagnosis not present

## 2023-06-17 DIAGNOSIS — I10 Essential (primary) hypertension: Secondary | ICD-10-CM | POA: Diagnosis not present

## 2023-06-17 DIAGNOSIS — R972 Elevated prostate specific antigen [PSA]: Secondary | ICD-10-CM | POA: Diagnosis not present

## 2023-06-17 DIAGNOSIS — D126 Benign neoplasm of colon, unspecified: Secondary | ICD-10-CM | POA: Diagnosis not present

## 2023-06-17 DIAGNOSIS — N401 Enlarged prostate with lower urinary tract symptoms: Secondary | ICD-10-CM | POA: Diagnosis not present

## 2023-06-17 DIAGNOSIS — Z0000 Encounter for general adult medical examination without abnormal findings: Secondary | ICD-10-CM | POA: Diagnosis not present

## 2023-06-17 DIAGNOSIS — I700 Atherosclerosis of aorta: Secondary | ICD-10-CM | POA: Diagnosis not present

## 2023-06-17 DIAGNOSIS — C4490 Unspecified malignant neoplasm of skin, unspecified: Secondary | ICD-10-CM | POA: Diagnosis not present

## 2023-07-04 ENCOUNTER — Other Ambulatory Visit: Payer: Self-pay | Admitting: Cardiovascular Disease

## 2023-07-05 ENCOUNTER — Other Ambulatory Visit: Payer: Self-pay | Admitting: Cardiovascular Disease

## 2023-07-16 ENCOUNTER — Other Ambulatory Visit: Payer: Self-pay | Admitting: Cardiovascular Disease

## 2023-07-18 ENCOUNTER — Other Ambulatory Visit: Payer: Self-pay | Admitting: Cardiovascular Disease

## 2023-07-24 ENCOUNTER — Other Ambulatory Visit: Payer: Self-pay | Admitting: Neurosurgery

## 2023-07-24 DIAGNOSIS — D329 Benign neoplasm of meninges, unspecified: Secondary | ICD-10-CM

## 2023-07-26 DIAGNOSIS — R972 Elevated prostate specific antigen [PSA]: Secondary | ICD-10-CM | POA: Diagnosis not present

## 2023-08-07 NOTE — Progress Notes (Unsigned)
 Cardiology Office Note    Date:  08/08/2023   ID:  Alexander Frazier, DOB [birth year], MRN [id number]  PCP:  Nichole Senior, MD  Cardiologist:  Evalene Lunger, MD  Electrophysiologist:  None   Chief Complaint: 1 year follow up  History of Present Illness:   Alexander Frazier is a 78 y.o. male with history of CAD noted on noninvasive imaging, thoracic aortic aneurysm, hypertension, and hyperlipidemia who presents for follow up on CAD.    Patient underwent calcium scoring 01/2019 which showed a calcium score of 489 with three-vessel coronary artery calcification.  This was 64th percentile.  Noncardiac overread showed a 4 cm ascending thoracic aortic aneurysm.  Follow-up CT scan 02/2020 showed ascending aorta estimated at 3.8 cm with mild aortic atherosclerosis and mild coronary calcification.  Echo 02/2020 showed EF 60 to 65%, G2 DD, mild MR.   Patient was most recently seen in the office 05/2022 by Dr. Gollan and overall doing well from a cardiac perspective.  There was no testing or medication changes indicated at that time.  Patient seen in clinic today doing well from a cardiac perspective.  He is without symptoms of angina or cardiac decompensation.  He denies chest pain, shortness of breath, lightheadedness, dizziness, palpitations, and lower extremity swelling.  He stays active by walking with his wife several days per week.  He reports recently seeing his PCP and having lab work done which was all normal per patient.   Labs independently reviewed: Will request labs from PCP, Dr. Nichole  Objective   Past Medical History:  Diagnosis Date   BPH (benign prostatic hyperplasia)    CAD (coronary artery disease)    Diastolic dysfunction    Diverticulosis large intestine w/o perforation or abscess w/o bleeding    Forgetfulness    History of kidney stones    Patient reports 10 plus years   Hyperlipidemia    Hypertension    Skin cancer    Thoracic aortic aneurysm (HCC)     Current  Medications: Current Meds  Medication Sig   aspirin  EC 81 MG tablet Take 1 tablet (81 mg total) by mouth daily.   enalapril  (VASOTEC ) 10 MG tablet TAKE 1 TABLET BY MOUTH DAILY   ezetimibe  (ZETIA ) 10 MG tablet TAKE 1 TABLET BY MOUTH DAILY   Multiple Vitamins-Minerals (DAILY MULTIVITAMIN) CAPS Take 1 tablet by mouth daily.   naproxen sodium (ALEVE) 220 MG tablet Take 220 mg by mouth daily as needed (pain).   Polyethyl Glycol-Propyl Glycol (SYSTANE OP) Place 1 drop into both eyes daily as needed (irritation).   simvastatin  (ZOCOR ) 20 MG tablet TAKE 1 TABLET BY MOUTH DAILY    Allergies:   Patient has no known allergies.   Social History   Socioeconomic History   Marital status: Married    Spouse name: Dorothe   Number of children: 3   Years of education: Not on file   Highest education level: Master's degree (e.g., MA, MS, MEng, MEd, MSW, MBA)  Occupational History    Comment: retired from [employer]  Tobacco Use   Smoking status: Never   Smokeless tobacco: Never  Vaping Use   Vaping status: Never Used  Substance and Sexual Activity   Alcohol  use: Not Currently   Drug use: Never   Sexual activity: Yes  Other Topics Concern   Not on file  Social History Narrative   Lives with wife   Social Drivers of Corporate investment banker Strain: Not on file  Food Insecurity:  Not on file  Transportation Needs: Not on file  Physical Activity: Not on file  Stress: Not on file  Social Connections: Not on file     Family History:  The patient's family history includes Bone cancer in his mother; Breast cancer in his mother; Dementia in his father; Diabetes in his brother; Hyperlipidemia in his brother and father; Hypertension in his brother, father, and mother; Stroke in his mother.  ROS:   12-point review of systems is negative unless otherwise noted in the HPI.  EKGs/Other Studies Reviewed:    EKG:  EKG personally reviewed by me today EKG Interpretation Date/Time:  Thursday August 08 2023 09:20:02 EDT Ventricular Rate:  76 PR Interval:  182 QRS Duration:  96 QT Interval:  392 QTC Calculation: 441 R Axis:   31  Text Interpretation: Normal sinus rhythm ST & T wave abnormality, consider anterolateral ischemia No previous ECGs available Confirmed by Lorene Sinclair ([id number]) on 08/08/2023 9:21:33 AM  PHYSICAL EXAM:    VS:  BP 128/74   Pulse 76   Ht 5' 11 (1.803 m)   Wt 175 lb 2 oz (79.4 kg)   SpO2 97%   BMI 24.42 kg/m   BMI: Body mass index is 24.42 kg/m.  Physical Exam Vitals and nursing note reviewed.  Constitutional:      Appearance: Normal appearance.  Cardiovascular:     Rate and Rhythm: Normal rate and regular rhythm.     Heart sounds: No murmur heard.    No gallop.  Pulmonary:     Effort: Pulmonary effort is normal.     Breath sounds: Normal breath sounds. No wheezing or rales.  Musculoskeletal:     Right lower leg: No edema.     Left lower leg: No edema.  Skin:    General: Skin is warm and dry.  Neurological:     General: No focal deficit present.     Mental Status: He is alert and oriented to person, place, and time. Mental status is at baseline.  Psychiatric:        Mood and Affect: Mood normal.        Behavior: Behavior normal.     Wt Readings from Last 3 Encounters:  08/08/23 175 lb 2 oz (79.4 kg)  07/31/22 176 lb 12.9 oz (80.2 kg)  07/30/22 176 lb 14.4 oz (80.2 kg)        ASSESSMENT & PLAN:   Coronary calcification on CT - Coronary calcium score 489 in 2020.  Patient is without symptoms of angina or cardiac decompensation.  No further ischemic evaluation indicated at this time.  Continue aspirin , ezetimibe , and simvastatin .  Hyperlipidemia - LDL at goal at prior visit.  Will request labs from PCP.  Continue ezetimibe  and simvastatin .  Hypertension - Blood pressure well-controlled.  Continue enalapril  as prescribed.  Thoracic aortic aneurysm - CT calcium score 01/2019 with incidental finding of ascending thoracic aneurysm 4  cm. Repeat CT angio chest 02/2020 with stable diameter at 3.8 cm. Echo 02/2020 showed trileaflet aorta measured 4.1 cm.  Will order repeat CT angio chest for surveillance.   Disposition: F/u with Dr. Gollan or an APP in 1 year or sooner as needed.   Medication Adjustments/Labs and Tests Ordered: Current medicines are reviewed at length with the patient today.  Concerns regarding medicines are outlined above. Medication changes, Labs and Tests ordered today are summarized above and listed in the Patient Instructions accessible in Encounters.   Bonney Sinclair Lorene, PA-C 08/08/2023 9:52 AM  [facility] [address] [place], [place] [address] [phone]

## 2023-08-08 ENCOUNTER — Encounter: Payer: Self-pay | Admitting: Medical

## 2023-08-08 ENCOUNTER — Ambulatory Visit: Attending: Medical | Admitting: Physician Assistant

## 2023-08-08 VITALS — BP 128/74 | HR 76 | Ht 71.0 in | Wt 175.1 lb

## 2023-08-08 DIAGNOSIS — I2510 Atherosclerotic heart disease of native coronary artery without angina pectoris: Secondary | ICD-10-CM | POA: Diagnosis not present

## 2023-08-08 DIAGNOSIS — E785 Hyperlipidemia, unspecified: Secondary | ICD-10-CM | POA: Insufficient documentation

## 2023-08-08 DIAGNOSIS — I7121 Aneurysm of the ascending aorta, without rupture: Secondary | ICD-10-CM | POA: Diagnosis not present

## 2023-08-08 DIAGNOSIS — I10 Essential (primary) hypertension: Secondary | ICD-10-CM | POA: Diagnosis not present

## 2023-08-08 NOTE — Patient Instructions (Addendum)
 Medication Instructions:  Your physician recommends that you continue on your current medications as directed. Please refer to the Current Medication list given to you today.   *If you need a refill on your cardiac medications before your next appointment, please call your pharmacy*  Lab Work: None ordered at this time  If you have labs (blood work) drawn today and your tests are completely normal, you will receive your results only by: MyChart Message (if you have MyChart) OR A paper copy in the mail If you have any lab test that is abnormal or we need to change your treatment, we will call you to review the results.  Testing/Procedures: CT Angiography (CTA) chest/aorta, is a special type of CT scan that uses a computer to produce multi-dimensional views of major blood vessels throughout the body. In CT angiography, a contrast material is injected through an IV to help visualize the blood vessels  Nothing to eat or drink 4 hours prior to test  [facility] [address]  [place], [place] [address]   Follow-Up: At [facility], you and your health needs are our priority.  As part of our continuing mission to provide you with exceptional heart care, our providers are all part of one team.  This team includes your primary Cardiologist (physician) and Advanced Practice Providers or APPs (Physician Assistants and Nurse Practitioners) who all work together to provide you with the care you need, when you need it.  Your next appointment:   1 year(s)  Provider:   You may see Timothy Gollan, MD or one of the following Advanced Practice Providers on your designated Care Team:   Lonni Meager, NP Lesley Maffucci, PA-C Bernardino Bring, PA-C Cadence Moberly, PA-C Tylene Lunch, NP Barnie Hila, NP

## 2023-08-23 ENCOUNTER — Ambulatory Visit
Admission: RE | Admit: 2023-08-23 | Discharge: 2023-08-23 | Disposition: A | Discharge status: Home / Self Care | Source: Ambulatory Visit | Attending: Medical | Admitting: Medical

## 2023-08-23 DIAGNOSIS — I7121 Aneurysm of the ascending aorta, without rupture: Secondary | ICD-10-CM | POA: Diagnosis not present

## 2023-08-23 MED ORDER — IOHEXOL 350 MG/ML SOLN
75.0000 mL | Freq: Once | INTRAVENOUS | Status: AC | PRN
  Administered 2023-08-23: 75 mL via INTRAVENOUS

## 2023-08-28 ENCOUNTER — Ambulatory Visit
Admission: RE | Admit: 2023-08-28 | Discharge: 2023-08-28 | Disposition: A | Discharge status: Home / Self Care | Source: Ambulatory Visit | Attending: Neurosurgery | Admitting: Neurosurgery

## 2023-08-28 DIAGNOSIS — D329 Benign neoplasm of meninges, unspecified: Secondary | ICD-10-CM

## 2023-08-28 DIAGNOSIS — D320 Benign neoplasm of cerebral meninges: Secondary | ICD-10-CM | POA: Diagnosis not present

## 2023-08-28 DIAGNOSIS — Z9889 Other specified postprocedural states: Secondary | ICD-10-CM | POA: Diagnosis not present

## 2023-08-28 MED ORDER — GADOPICLENOL 0.5 MMOL/ML IV SOLN
7.5000 mL | Freq: Once | INTRAVENOUS | Status: AC | PRN
  Administered 2023-08-28: 7.5 mL via INTRAVENOUS

## 2023-08-29 ENCOUNTER — Ambulatory Visit: Payer: Self-pay | Admitting: Medical

## 2023-09-12 ENCOUNTER — Telehealth: Payer: Self-pay | Admitting: Cardiovascular Disease

## 2023-09-12 MED ORDER — ENALAPRIL MALEATE 10 MG PO TABS: 10.0000 mg | ORAL_TABLET | Freq: Every day | ORAL | 3 refills | Status: AC

## 2023-09-12 NOTE — Telephone Encounter (Signed)
 Rx sent in

## 2023-09-12 NOTE — Telephone Encounter (Signed)
*  STAT* If patient is at the pharmacy, call can be transferred to refill team.   1. Which medications need to be refilled? (please list name of each medication and dose if known) enalapril  (VASOTEC ) 10 MG tablet   2. Which pharmacy/location (including street and city if local pharmacy) is medication to be sent to?  [facility] [id number] - [place], [place] - [address]      3. Do they need a 30 day or 90 day supply? 90 day    Pt is out of medication

## 2023-10-14 DIAGNOSIS — D329 Benign neoplasm of meninges, unspecified: Secondary | ICD-10-CM | POA: Diagnosis not present

## 2023-12-06 ENCOUNTER — Other Ambulatory Visit: Payer: Self-pay | Admitting: Cardiovascular Disease

## 2023-12-28 ENCOUNTER — Emergency Department (HOSPITAL_COMMUNITY)

## 2023-12-28 ENCOUNTER — Emergency Department (HOSPITAL_COMMUNITY)
Admission: EM | Admit: 2023-12-28 | Discharge: 2023-12-28 | Disposition: A | Discharge status: Home / Self Care | Attending: Emergency Medicine | Admitting: Emergency Medicine

## 2023-12-28 ENCOUNTER — Encounter (HOSPITAL_COMMUNITY): Payer: Self-pay

## 2023-12-28 ENCOUNTER — Other Ambulatory Visit: Payer: Self-pay

## 2023-12-28 DIAGNOSIS — I700 Atherosclerosis of aorta: Secondary | ICD-10-CM | POA: Diagnosis not present

## 2023-12-28 DIAGNOSIS — I10 Essential (primary) hypertension: Secondary | ICD-10-CM | POA: Diagnosis not present

## 2023-12-28 DIAGNOSIS — R32 Unspecified urinary incontinence: Secondary | ICD-10-CM | POA: Insufficient documentation

## 2023-12-28 DIAGNOSIS — G9389 Other specified disorders of brain: Secondary | ICD-10-CM | POA: Diagnosis not present

## 2023-12-28 DIAGNOSIS — R569 Unspecified convulsions: Secondary | ICD-10-CM | POA: Insufficient documentation

## 2023-12-28 DIAGNOSIS — I2510 Atherosclerotic heart disease of native coronary artery without angina pectoris: Secondary | ICD-10-CM | POA: Diagnosis not present

## 2023-12-28 DIAGNOSIS — Z7982 Long term (current) use of aspirin: Secondary | ICD-10-CM | POA: Insufficient documentation

## 2023-12-28 DIAGNOSIS — R4182 Altered mental status, unspecified: Secondary | ICD-10-CM | POA: Diagnosis not present

## 2023-12-28 DIAGNOSIS — Z79899 Other long term (current) drug therapy: Secondary | ICD-10-CM | POA: Diagnosis not present

## 2023-12-28 DIAGNOSIS — R410 Disorientation, unspecified: Secondary | ICD-10-CM | POA: Diagnosis not present

## 2023-12-28 LAB — CBC WITH DIFFERENTIAL/PLATELET
Abs Immature Granulocytes: 0.03 K/uL (ref 0.00–0.07)
Basophils Absolute: 0 K/uL (ref 0.0–0.1)
Basophils Relative: 1 %
Eosinophils Absolute: 0.2 K/uL (ref 0.0–0.5)
Eosinophils Relative: 3 %
HCT: 42.3 % (ref 39.0–52.0)
Hemoglobin: 14.3 g/dL (ref 13.0–17.0)
Immature Granulocytes: 1 %
Lymphocytes Relative: 29 %
Lymphs Abs: 1.8 K/uL (ref 0.7–4.0)
MCH: 30.7 pg (ref 26.0–34.0)
MCHC: 33.8 g/dL (ref 30.0–36.0)
MCV: 90.8 fL (ref 80.0–100.0)
Monocytes Absolute: 0.5 K/uL (ref 0.1–1.0)
Monocytes Relative: 7 %
Neutro Abs: 3.8 K/uL (ref 1.7–7.7)
Neutrophils Relative %: 59 %
Platelets: 242 K/uL (ref 150–400)
RBC: 4.66 MIL/uL (ref 4.22–5.81)
RDW: 13.4 % (ref 11.5–15.5)
WBC: 6.3 K/uL (ref 4.0–10.5)
nRBC: 0 % (ref 0.0–0.2)

## 2023-12-28 LAB — URINALYSIS, W/ REFLEX TO CULTURE (INFECTION SUSPECTED)
Bacteria, UA: NONE SEEN
Bilirubin Urine: NEGATIVE
Glucose, UA: NEGATIVE mg/dL
Hgb urine dipstick: NEGATIVE
Ketones, ur: NEGATIVE mg/dL
Leukocytes,Ua: NEGATIVE
Nitrite: NEGATIVE
Protein, ur: NEGATIVE mg/dL
Specific Gravity, Urine: 1.013 (ref 1.005–1.030)
pH: 5 (ref 5.0–8.0)

## 2023-12-28 LAB — COMPREHENSIVE METABOLIC PANEL WITH GFR
ALT: 18 U/L (ref 0–44)
AST: 29 U/L (ref 15–41)
Albumin: 3.6 g/dL (ref 3.5–5.0)
Alkaline Phosphatase: 65 U/L (ref 38–126)
Anion gap: 13 (ref 5–15)
BUN: 12 mg/dL (ref 8–23)
CO2: 19 mmol/L — ABNORMAL LOW (ref 22–32)
Calcium: 9 mg/dL (ref 8.9–10.3)
Chloride: 107 mmol/L (ref 98–111)
Creatinine, Ser: 1.13 mg/dL (ref 0.61–1.24)
GFR, Estimated: >60 mL/min (ref >60)
Glucose, Bld: 131 mg/dL — ABNORMAL HIGH (ref 70–99)
Potassium: 4.6 mmol/L (ref 3.5–5.1)
Sodium: 139 mmol/L (ref 135–145)
Total Bilirubin: 0.7 mg/dL (ref 0.0–1.2)
Total Protein: 6.6 g/dL (ref 6.5–8.1)

## 2023-12-28 LAB — RAPID URINE DRUG SCREEN, HOSP PERFORMED
Amphetamines: NOT DETECTED
Barbiturates: NOT DETECTED
Benzodiazepines: POSITIVE — AB
Cocaine: NOT DETECTED
Opiates: NOT DETECTED
Tetrahydrocannabinol: NOT DETECTED

## 2023-12-28 LAB — ETHANOL: Alcohol, Ethyl (B): <15 mg/dL (ref <15)

## 2023-12-28 LAB — ACETAMINOPHEN LEVEL: Acetaminophen (Tylenol), Serum: <10 ug/mL — ABNORMAL LOW (ref 10–30)

## 2023-12-28 LAB — SALICYLATE LEVEL: Salicylate Lvl: <7 mg/dL — ABNORMAL LOW (ref 7.0–30.0)

## 2023-12-28 MED ORDER — LEVETIRACETAM 500 MG PO TABS: 500.0000 mg | ORAL_TABLET | Freq: Two times a day (BID) | ORAL | 0 refills | Status: DC

## 2023-12-28 MED ORDER — LORAZEPAM 2 MG/ML IJ SOLN
1.0000 mg | Freq: Once | INTRAMUSCULAR | Status: AC
  Administered 2023-12-28: 1 mg via INTRAVENOUS
  Filled 2023-12-28: qty 1

## 2023-12-28 MED ORDER — LEVETIRACETAM (KEPPRA) 500 MG/5 ML ADULT IV PUSH
1500.0000 mg | Freq: Once | INTRAVENOUS | Status: AC
  Administered 2023-12-28: 1500 mg via INTRAVENOUS
  Filled 2023-12-28: qty 15

## 2023-12-28 NOTE — ED Provider Notes (Signed)
 [name] Provider Note   CSN: [id number] Arrival date & time: 12/28/23  9780     Patient presents with: Altered Mental Status   Alexander Frazier is a 78 y.o. male.   The history is provided by the patient and medical records.  Altered Mental Status  78 year old male with history of hyperlipidemia, coronary artery disease, hypertension, kidney stones, history of meningioma, presenting to the ED with altered mental status.  Patient is from home, wife apparently heard a loud thud and found patient on the ground.  He did have urinary incontinence.  EMS was called and upon arrival he was altered and combative.  No observed seizure activity.  No prior history of seizures.  Meningioma was removed in June 2024 by Dr. Lanis.    Prior to Admission medications   Medication Sig Start Date End Date Taking? Authorizing Provider  aspirin  EC 81 MG tablet Take 1 tablet (81 mg total) by mouth daily. 08/06/22   Lanis Pupa, MD  enalapril  (VASOTEC ) 10 MG tablet Take 1 tablet (10 mg total) by mouth daily. 09/12/23   Gollan, Timothy J, MD  ezetimibe  (ZETIA ) 10 MG tablet TAKE 1 TABLET BY MOUTH DAILY 12/06/23   Lorene Sinclair L, PA-C  methylPREDNISolone  (MEDROL  DOSEPAK) 4 MG TBPK tablet Take as directed on package Patient not taking: Reported on 08/08/2023 08/02/22   Lanis Pupa, MD  Multiple Vitamins-Minerals (DAILY MULTIVITAMIN) CAPS Take 1 tablet by mouth daily. 12/03/16   [provider]  naproxen sodium (ALEVE) 220 MG tablet Take 220 mg by mouth daily as needed (pain).    [provider]  Polyethyl Glycol-Propyl Glycol (SYSTANE OP) Place 1 drop into both eyes daily as needed (irritation).    [provider]  simvastatin  (ZOCOR ) 20 MG tablet TAKE 1 TABLET BY MOUTH DAILY 12/06/23   Carey, Mariah L, PA-C    Allergies: Patient has no known allergies.    Review of Systems  Unable to perform ROS: Mental status change     Updated Vital Signs BP (!) 159/84   Pulse (!) 106   Temp 98 F (36.7 C) (Oral)   Resp 19   SpO2 95%   Physical Exam Vitals and nursing note reviewed.  Constitutional:      Appearance: He is well-developed.  HENT:     Head: Normocephalic and atraumatic.  Eyes:     Conjunctiva/sclera: Conjunctivae normal.     Pupils: Pupils are equal, round, and reactive to light.  Neck:     Comments: C-collar in place alert Cardiovascular:     Rate and Rhythm: Normal rate and regular rhythm.     Heart sounds: Normal heart sounds.  Pulmonary:     Effort: Pulmonary effort is normal.     Breath sounds: Normal breath sounds.  Abdominal:     General: Bowel sounds are normal.     Palpations: Abdomen is soft.  Musculoskeletal:        General: Normal range of motion.  Skin:    General: Skin is warm and dry.  Neurological:     Comments: Alert, appears confused, pulling at IV and monitoring devices, able to state name but not following many other commands, moving extremities spontaneously without focal deficit     (all labs ordered are listed, but only abnormal results are displayed) Labs Reviewed  COMPREHENSIVE METABOLIC PANEL WITH GFR - Abnormal; Notable for the following components:      Result Value   CO2 19 (*)  Glucose, Bld 131 (*)    All other components within normal limits  ACETAMINOPHEN  LEVEL - Abnormal; Notable for the following components:   Acetaminophen  (Tylenol ), Serum <10 (*)    All other components within normal limits  SALICYLATE LEVEL - Abnormal; Notable for the following components:   Salicylate Lvl <7.0 (*)    All other components within normal limits  CBC WITH DIFFERENTIAL/PLATELET  ETHANOL  RAPID URINE DRUG SCREEN, HOSP PERFORMED  URINALYSIS, W/ REFLEX TO CULTURE (INFECTION SUSPECTED)    EKG: None  Radiology: DG Chest 2 View Result Date: 12/28/2023 EXAM: 2 VIEW(S) XRAY OF THE CHEST 12/28/2023 03:23:00 AM COMPARISON: Comparison from 08/23/2023. CLINICAL  HISTORY: AMS FINDINGS: LUNGS AND PLEURA: Lungs are clear. HEART AND MEDIASTINUM: Cardiac shadow is within normal limits. Mild aortic calcifications are seen. BONES AND SOFT TISSUES: Mild degenerative changes of thoracic spine. IMPRESSION: 1. No acute cardiopulmonary process identified. Electronically signed by: Oneil Devonshire MD 12/28/2023 03:37 AM EST RP Workstation: [id number]   CT Head Wo Contrast Result Date: 12/28/2023 EXAM: CT HEAD AND CERVICAL SPINE 12/28/2023 02:53:00 AM TECHNIQUE: CT of the head and cervical spine was performed without the administration of intravenous contrast. Multiplanar reformatted images are provided for review. Automated exposure control, iterative reconstruction, and/or weight based adjustment of the mA/kV was utilized to reduce the radiation dose to as low as reasonably achievable. COMPARISON: MRI head August 28, 2023 CLINICAL HISTORY: Delirium; hx brain tumor, ? seizure FINDINGS: CT HEAD BRAIN AND VENTRICLES: Approximately 4 mm thick extra-axial hyperdensity along the right frontal convexity subjacent to a craniotomy. Right anterior temporal lobe encephalomalacia. No mass effect or midline shift. No evidence of acute infarct. No hydrocephalus. Known residual meningioma better characterized on the prior MRI. ORBITS: No acute abnormality. SINUSES AND MASTOIDS: No acute abnormality. SOFT TISSUES AND SKULL: No acute skull fracture. No acute soft tissue abnormality. CT CERVICAL SPINE BONES AND ALIGNMENT: No acute fracture or traumatic malalignment. DEGENERATIVE CHANGES: No significant degenerative changes. SOFT TISSUES: No prevertebral soft tissue swelling. Findings discussed with Dr. Haze via telephone at 3:20 AM. IMPRESSION: 1. Approximately 4 mm thick extra-axial hyperdensity along the right frontal convexity subjacent to a craniotomy is favored to represent dural thickening when comparing to the prior MRI, but acute hemorrhage is difficult to fully exclude. A close interval follow  up CT head could ensure stability. 2. No acute fracture or traumatic malalignment of the cervical spine. 3. Known residual meningioma better characterized on the prior MRI. Electronically signed by: Gilmore Molt MD 12/28/2023 03:30 AM EST RP Workstation: [id number]   CT Cervical Spine Wo Contrast Result Date: 12/28/2023 EXAM: CT HEAD AND CERVICAL SPINE 12/28/2023 02:53:00 AM TECHNIQUE: CT of the head and cervical spine was performed without the administration of intravenous contrast. Multiplanar reformatted images are provided for review. Automated exposure control, iterative reconstruction, and/or weight based adjustment of the mA/kV was utilized to reduce the radiation dose to as low as reasonably achievable. COMPARISON: MRI head August 28, 2023 CLINICAL HISTORY: Delirium; hx brain tumor, ? seizure FINDINGS: CT HEAD BRAIN AND VENTRICLES: Approximately 4 mm thick extra-axial hyperdensity along the right frontal convexity subjacent to a craniotomy. Right anterior temporal lobe encephalomalacia. No mass effect or midline shift. No evidence of acute infarct. No hydrocephalus. Known residual meningioma better characterized on the prior MRI. ORBITS: No acute abnormality. SINUSES AND MASTOIDS: No acute abnormality. SOFT TISSUES AND SKULL: No acute skull fracture. No acute soft tissue abnormality. CT CERVICAL SPINE BONES AND ALIGNMENT: No acute fracture or traumatic malalignment.  DEGENERATIVE CHANGES: No significant degenerative changes. SOFT TISSUES: No prevertebral soft tissue swelling. Findings discussed with Dr. Haze via telephone at 3:20 AM. IMPRESSION: 1. Approximately 4 mm thick extra-axial hyperdensity along the right frontal convexity subjacent to a craniotomy is favored to represent dural thickening when comparing to the prior MRI, but acute hemorrhage is difficult to fully exclude. A close interval follow up CT head could ensure stability. 2. No acute fracture or traumatic malalignment of the cervical  spine. 3. Known residual meningioma better characterized on the prior MRI. Electronically signed by: Gilmore Molt MD 12/28/2023 03:30 AM EST RP Workstation: [id number]     Procedures   Medications Ordered in the ED  LORazepam  (ATIVAN ) injection 1 mg (1 mg Intravenous Given 12/28/23 0240)  levETIRAcetam  (KEPPRA ) undiluted injection 1,500 mg (1,500 mg Intravenous Given 12/28/23 0407)                                    Medical Decision Making Amount and/or Complexity of Data Reviewed Labs: ordered. Radiology: ordered and independent interpretation performed. ECG/medicine tests: ordered and independent interpretation performed.  Risk Prescription drug management.   78 year old male presenting to the ED with altered mental status.  Wife heard a loud thud and found him on the floor.  Did have urinary incontinence.  He was postictal and combative with EMS.  No prior history of seizure disorder but does have history of meningioma status post resection in June 2024 by Dr. Lanis.  Patient is awake and alert here.  He is able to state name and following limited commands.  He still appears vastly confused, continuing to try and pull out IV, remove monitoring devices, etc.  Given dose of Ativan  here.  Plan for CT head neck, chest x-ray, screening labs.  CT head with increased dural markings, less likely hemorrhage after discussion with radiology.  Appears similar to prior MRI.  Does have some residual meningioma as well which is known.  No cervical spine fracture.  Chest x-ray is clear.  Labs are grossly reassuring.  4:05 AM Spoke with neurology, Dr. Vanessa-- has reviewed prior scans and CT from today.  Recommends IV load with keppra  here now, will start 500mg  BID at home. On reassessment, patient is sleeping soundly.  Discussed with family.  He did require medications for sedation due to his combativeness.  Will plan to observe here to ensure he returns to baseline.  Per neurology, if  not returning back to baseline in a few hours by 8 to 9 AM, can consider admission for observation and EEG.  6:24 AM Patient is coming around, much improved from prior.  We were able to discuss seizure today, test results, and plan of care moving forward.  He acknowledged this.  Still drowsy right now, has not been able to get up and ambulate or tolerate PO yet.  Discussed with family that we will continue to observe for now, anticipate he will be able to discharge once fully back to baseline.  I have already sent Keppra  to pharmacy and placed an ambulatory referral to neurology.  I have notified him of driving restrictions until cleared by neurology.  Care will be signed out to oncoming provider.  Anticipate discharge once fully back to baseline, ambulatory and tolerating PO.  Final diagnoses:  Seizure [facility])    ED Discharge Orders          Ordered    levETIRAcetam  (KEPPRA ) 500  MG tablet  2 times daily        12/28/23 0616    Ambulatory referral to Neurology       Comments: An appointment is requested in approximately: 2 weeks   12/28/23 0616               Jarold Olam HERO, PA-C 12/28/23 9364    Haze Lonni PARAS, MD 12/28/23 [phone]

## 2023-12-28 NOTE — ED Triage Notes (Signed)
 BIBEMS for AMS. Pt wife states she heard a thud and found pt on the ground. LKN 0155. Pt altered and combative with EMS. Normally A&Ox4. Pt found incontinent. P{t given 5mg  versed IM and restrained by EMS

## 2023-12-28 NOTE — Discharge Instructions (Addendum)
 We have placed a referral to neurology office-- they should contact you Monday/Tuesday to get appt scheduled.  I have attached their office information as well. No driving until cleared by neurology to do so.  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 Take the prescribed medication as directed.  Follow-up with your primary care doctor in the interim. Return to the ED for new or worsening symptoms.  If you experience any of the following symptoms including but not limited to unexplained weakness, further seizures, further falls, vision changes, neurologic deficit, or other concerning symptom please return to the emergency department or seek further medical care.  Please make your primary care provider aware of your findings and workup today including imaging findings as well as your new medications for seizures.  Please follow-up with your primary care provider within the next 1 to 2 weeks for a post-discharge visit and follow-up with neurology.

## 2023-12-28 NOTE — ED Notes (Signed)
 Patient Alert and oriented to baseline. Stable and ambulatory to baseline. Patient verbalized understanding of the discharge instructions.  Patient belongings were taken by the patient.

## 2023-12-28 NOTE — ED Provider Notes (Signed)
 Physical Exam  BP 121/71   Pulse 79   Temp 98 F (36.7 C) (Oral)   Resp 18   SpO2 99%   Physical Exam Vitals and nursing note reviewed.  Constitutional:      General: He is awake. He is not in acute distress.    Appearance: Normal appearance. He is not ill-appearing, toxic-appearing or diaphoretic.  HENT:     Head: Normocephalic and atraumatic.     Mouth/Throat:     Comments: No sign of tongue injury Eyes:     General: No scleral icterus.    Extraocular Movements: Extraocular movements intact.     Conjunctiva/sclera: Conjunctivae normal.     Pupils: Pupils are equal, round, and reactive to light.     Comments: Vision grossly intact  Pulmonary:     Effort: Pulmonary effort is normal. No respiratory distress.  Musculoskeletal:        General: Normal range of motion.     Cervical back: Normal range of motion.     Right lower leg: No edema.     Left lower leg: No edema.     Comments: Grossly normal range of motion of all 4 extremities, patient able to raise bilateral upper and lower extremities against gravity as well as resistance, equal grip strength bilaterally  Patient ambulatory without assistance  Skin:    General: Skin is warm.     Capillary Refill: Capillary refill takes less than 2 seconds.     Comments: Bruising present to bilateral upper extremities presumably from fall  Neurological:     General: No focal deficit present.     Mental Status: He is alert and oriented to person, place, and time.     Sensory: No sensory deficit.     Motor: No weakness.     Coordination: Coordination normal.     Gait: Gait normal.  Psychiatric:        Mood and Affect: Mood normal.        Behavior: Behavior normal. Behavior is cooperative.     Procedures  Procedures  ED Course / MDM   Clinical Course as of 12/28/23 1019  Sat Dec 28, 2023  9364 Seizure, 500 BID for keppra , sedated with Versed, metabolizing, able to wake up and have a conversation [CH]  0636 Road test [CH]   [phone] Sal with neurology does not feel we need repeat imaging  [CH]    Clinical Course User Index [CH] Dream Nodal, [place] FALCON, PA-C   Medical Decision Making Amount and/or Complexity of Data Reviewed Labs: ordered. Radiology: ordered. ECG/medicine tests: ordered.  Risk Prescription drug management.   Patient signed out myself at time of shift change by Olam Slocumb PA-C, please see her not for further detail. Briefly patient had what is most likely a unwitnessed seizure episode as he was post-ictal and combative at time of EMS arrival. Patient has improved clinically after seizure medications in the ED and is pending road test and PO challenge. Previous EDP spoke with neurology who recommended starting Keppra  and with clinical improvement discharge home pending road test and PO.   Patient reassessed, patient now feeling much better and tolerating p.o.  Patient ambulatory without assistance.  At this time I feel patient is stable for discharge based off of discussion with previous EDP and previous EDP's discussion with neurology.  Extensive return precautions given and patient instructed to pick up medication from the pharmacy and take as prescribed. Patient understanding this and understanding of the need to follow-up with  neurology as well as primary care.  Patient discharged.       Janetta Terrall FALCON, PA-C 12/28/23 1608    Laurice Maude BROCKS, MD 12/29/23 [phone]

## 2023-12-28 NOTE — ED Notes (Signed)
 Pt eating

## 2023-12-28 NOTE — ED Notes (Signed)
 Pt ambulated without difficulty

## 2023-12-31 ENCOUNTER — Encounter: Payer: Self-pay | Admitting: Neurology

## 2023-12-31 ENCOUNTER — Ambulatory Visit (INDEPENDENT_AMBULATORY_CARE_PROVIDER_SITE_OTHER): Admitting: Neurology

## 2023-12-31 VITALS — BP 144/89 | HR 80 | Ht 70.0 in | Wt 183.0 lb

## 2023-12-31 DIAGNOSIS — G40109 Localization-related (focal) (partial) symptomatic epilepsy and epileptic syndromes with simple partial seizures, not intractable, without status epilepticus: Secondary | ICD-10-CM | POA: Diagnosis not present

## 2023-12-31 DIAGNOSIS — D329 Benign neoplasm of meninges, unspecified: Secondary | ICD-10-CM | POA: Diagnosis not present

## 2023-12-31 DIAGNOSIS — Z5181 Encounter for therapeutic drug level monitoring: Secondary | ICD-10-CM

## 2023-12-31 MED ORDER — LEVETIRACETAM 500 MG PO TABS: 500.0000 mg | ORAL_TABLET | Freq: Two times a day (BID) | ORAL | 3 refills | Status: AC

## 2023-12-31 NOTE — Patient Instructions (Signed)
 Continue Keppra  500 mg twice daily, will obtain a Keppra  level Continue your other medications Will obtain a Keppra  level Please call for any breakthrough seizure Follow-up in 6 months or sooner if worse

## 2023-12-31 NOTE — Progress Notes (Signed)
 [facility]  PATIENT: Alexander Frazier DOB: [birth year]  REQUESTING CLINICIAN: Jarold Olam HERO, PA-C HISTORY FROM: Patient, wife and daughter-in-law REASON FOR VISIT: New onset epilepsy   HISTORICAL  CHIEF COMPLAINT:  Chief Complaint  Patient presents with   New Patient (Initial Visit)    Pt in room 13. Wife and daughter in law in room. Internal hospital referral for seizure.    HISTORY OF PRESENT ILLNESS:  Discussed the use of AI scribe software for clinical note transcription with the patient, who gave verbal consent to proceed.  Alexander Frazier is an 78 year old male with a history of meningioma status post resection 2024, hypertension, hyperlipidemia who presents with a first-time seizure.  He experienced a seizure while asleep, first noticed by his spouse who heard a noise and found him partially off the bed with his head hitting the nightstand. His breathing was strange and heavy, and he was unresponsive. Emergency medical services were called, and he was taken to the hospital where he was released the following day. He does not recall the event or the day it occurred, and his memory only became clear the day after his hospital release.  He has a history of meningioma resection last year. He has not experienced any previous seizures. He reports feeling fine on Keppra  and has not experienced any side effects such as sleepiness or irritability.  He sustained a head injury during the seizure but reports it was not severe. He also has bruising on his arms, which may be related to the seizure or medical interventions during the event.  He has a history of high blood pressure, which is described as 'a little high' but not overly concerning.  No tongue biting during the seizure. He has not experienced any recent falls other than the seizure event.   Handedness: Right handed   Onset: December 28, 2023  Seizure Type: Generalized convulsion  Current frequency: Only  once  Any injuries from seizures: Bruises  Seizure risk factors: Meningioma s/p resection 2024  Previous ASMs: None  Currenty ASMs: Levetiracetam  500 mg twice daily  ASMs side effects: Denies  Brain Images: Postsurgical changes  Previous EEGs: Not previously done   OTHER MEDICAL CONDITIONS: Hypertension, Meningioma resection last year, Hyperlipidemia   REVIEW OF SYSTEMS: Full 14 system review of systems performed and negative with exception of: As noted in the HPI   ALLERGIES: No Known Allergies  HOME MEDICATIONS: Outpatient Medications Prior to Visit  Medication Sig Dispense Refill   aspirin  EC 81 MG tablet Take 1 tablet (81 mg total) by mouth daily. 30 tablet 12   enalapril  (VASOTEC ) 10 MG tablet Take 1 tablet (10 mg total) by mouth daily. 90 tablet 3   ezetimibe  (ZETIA ) 10 MG tablet TAKE 1 TABLET BY MOUTH DAILY 90 tablet 2   Multiple Vitamins-Minerals (DAILY MULTIVITAMIN) CAPS Take 1 tablet by mouth daily.     naproxen sodium (ALEVE) 220 MG tablet Take 220 mg by mouth daily as needed (pain).     Polyethyl Glycol-Propyl Glycol (SYSTANE OP) Place 1 drop into both eyes daily as needed (irritation).     simvastatin  (ZOCOR ) 20 MG tablet TAKE 1 TABLET BY MOUTH DAILY 90 tablet 2   levETIRAcetam  (KEPPRA ) 500 MG tablet Take 1 tablet (500 mg total) by mouth 2 (two) times daily. 60 tablet 0   methylPREDNISolone  (MEDROL  DOSEPAK) 4 MG TBPK tablet Take as directed on package (Patient not taking: Reported on 12/31/2023) 21 each 0   No facility-administered medications  prior to visit.    PAST MEDICAL HISTORY: Past Medical History:  Diagnosis Date   BPH (benign prostatic hyperplasia)    CAD (coronary artery disease)    Diastolic dysfunction    Diverticulosis large intestine w/o perforation or abscess w/o bleeding    Forgetfulness    History of kidney stones    Patient reports 10 plus years   Hyperlipidemia    Hypertension    Skin cancer    Thoracic aortic aneurysm     PAST  SURGICAL HISTORY: Past Surgical History:  Procedure Laterality Date   APPENDECTOMY     APPLICATION OF CRANIAL NAVIGATION Right 07/31/2022   Procedure: APPLICATION OF CRANIAL NAVIGATION;  Surgeon: Lanis Pupa, MD;  Location: [facility] OR;  Service: Neurosurgery;  Laterality: Right;   CATARACT EXTRACTION Bilateral 2018   COLON SURGERY     pateint reports colonoscopy over 6 years   CRANIOTOMY Right 07/31/2022   Procedure: PTERIONAL CRANIOTOMY FOR RESECTION OF MENINGIOMA;  Surgeon: Lanis Pupa, MD;  Location: [facility] OR;  Service: Neurosurgery;  Laterality: Right;   HERNIA REPAIR     MOHS SURGERY     VASECTOMY      FAMILY HISTORY: Family History  Problem Relation Age of Onset   Stroke Mother    Breast cancer Mother    Bone cancer Mother    Hypertension Mother    Hyperlipidemia Father    Dementia Father    Hypertension Father    Diabetes Brother    Hyperlipidemia Brother    Hypertension Brother     SOCIAL HISTORY: Social History   Socioeconomic History   Marital status: Married    Spouse name: Dorothe   Number of children: 3   Years of education: Not on file   Highest education level: Master's degree (e.g., MA, MS, MEng, MEd, MSW, MBA)  Occupational History    Comment: retired from [employer]  Tobacco Use   Smoking status: Never   Smokeless tobacco: Never  Vaping Use   Vaping status: Never Used  Substance and Sexual Activity   Alcohol  use: Not Currently   Drug use: Never   Sexual activity: Yes  Other Topics Concern   Not on file  Social History Narrative   Lives with wife   Social Drivers of Corporate Investment Banker Strain: Not on file  Food Insecurity: Not on file  Transportation Needs: Not on file  Physical Activity: Not on file  Stress: Not on file  Social Connections: Not on file  Intimate Partner Violence: Not on file    PHYSICAL EXAM  GENERAL EXAM/CONSTITUTIONAL: Vitals:  Vitals:   12/31/23 1434  BP: (!) 144/89  Pulse: 80  Weight: 183 lb  (83 kg)  Height: 5' 10 (1.778 m)   Body mass index is 26.26 kg/m. Wt Readings from Last 3 Encounters:  12/31/23 183 lb (83 kg)  08/08/23 175 lb 2 oz (79.4 kg)  07/31/22 176 lb 12.9 oz (80.2 kg)   Patient is in no distress; well developed, nourished and groomed; neck is supple  MUSCULOSKELETAL: Gait, strength, tone, movements noted in Neurologic exam below  NEUROLOGIC: MENTAL STATUS:     01/12/2021    9:24 AM  MMSE - Mini Mental State Exam  Orientation to time 5  Orientation to Place 4  Registration 3  Attention/ Calculation 4  Recall 3  Language- name 2 objects 2  Language- repeat 1  Language- follow 3 step command 3  Language- read & follow direction 1  Write a  sentence 1  Copy design 1  Total score 28   awake, alert, oriented to person, place and time recent and remote memory intact normal attention and concentration language fluent, comprehension intact, naming intact fund of knowledge appropriate  CRANIAL NERVE:  2nd, 3rd, 4th, 6th - Visual fields full to confrontation, extraocular muscles intact, no nystagmus 5th - facial sensation symmetric 7th - facial strength symmetric 8th - hearing intact 9th - palate elevates symmetrically, uvula midline 11th - shoulder shrug symmetric 12th - tongue protrusion midline  MOTOR:  normal bulk and tone, full strength in the BUE, BLE  SENSORY:  normal and symmetric to light touch  COORDINATION:  finger-nose-finger, fine finger movements normal  GAIT/STATION:  normal    DIAGNOSTIC DATA (LABS, IMAGING, TESTING) - I reviewed patient records, labs, notes, testing and imaging myself where available.  Lab Results  Component Value Date   WBC 6.3 12/28/2023   HGB 14.3 12/28/2023   HCT 42.3 12/28/2023   MCV 90.8 12/28/2023   PLT 242 12/28/2023      Component Value Date/Time   NA 139 12/28/2023 0226   K 4.6 12/28/2023 0226   CL 107 12/28/2023 0226   CO2 19 (L) 12/28/2023 0226   GLUCOSE 131 (H) 12/28/2023 0226    BUN 12 12/28/2023 0226   CREATININE 1.13 12/28/2023 0226   CALCIUM 9.0 12/28/2023 0226   PROT 6.6 12/28/2023 0226   ALBUMIN  3.6 12/28/2023 0226   AST 29 12/28/2023 0226   ALT 18 12/28/2023 0226   ALKPHOS 65 12/28/2023 0226   BILITOT 0.7 12/28/2023 0226   GFRNONAA >60 12/28/2023 0226   No results found for: CHOL, HDL, LDLCALC, LDLDIRECT, TRIG No results found for: HGBA1C Lab Results  Component Value Date   VITAMINB12 576 01/12/2021   No results found for: TSH  MRI Brain 08/28/2023 Unchanged residual meningioma in the right cavernous sinus.    CT Head 12/28/2023 1. Approximately 4 mm thick extra-axial hyperdensity along the right frontal convexity subjacent to a craniotomy is favored to represent dural thickening when comparing to the prior MRI, but acute hemorrhage is difficult to fully exclude. A close interval follow up CT head could ensure stability. 2. No acute fracture or traumatic malalignment of the cervical spine. 3. Known residual meningioma better characterized on the prior MRI.   ASSESSMENT AND PLAN  78 y.o. year old male  with history of hypertension, hyperlipidemia, meningioma s/p resection 2024 who is presenting after his first lifetime seizure.  This was a nocturnal seizure, no previous seizures in the past.  Patient was started on levetiracetam  500 mg twice daily, denies any side effect from the medication.  Plan will be to continue current medication, will obtain routine EEG and I will see him in 69-months for follow-up.  He understands to contact me for any breakthrough seizure.  We also discussed driving restriction for total of 6 months and he voiced understanding.   Localization-related epilepsy First seizure episode occurred during sleep. High risk of recurrence due to history of brain surgery and seizure during sleep. Keppra  (levetiracetam ) initiated in the hospital and well tolerated without significant side effects. Lifelong medication likely  necessary due to high risk of recurrence. Driving restrictions apply for six months post-seizure due to state law. - Continue Keppra  (levetiracetam ) indefinitely. - Ordered EEG to assess brainwave activity. - Scheduled follow-up appointment in six months. - Advised no driving for six months post-seizure.  Therapeutic drug monitoring for levetiracetam  Baseline level check for levetiracetam  to ensure therapeutic  levels are maintained. - Checked levetiracetam  level today for baseline.    1. Focal epilepsy (HCC)   2. Therapeutic drug monitoring   3. Meningioma [facility])     Patient Instructions  Continue Keppra  500 mg twice daily, will obtain a Keppra  level Continue your other medications Will obtain a Keppra  level Please call for any breakthrough seizure Follow-up in 6 months or sooner if worse   Per [place]  DMV statutes, patients with seizures are not allowed to drive until they have been seizure-free for six months.  Other recommendations include using caution when using heavy equipment or power tools. Avoid working on ladders or at heights. Take showers instead of baths.  Do not swim alone.  Ensure the water temperature is not too high on the home water heater. Do not go swimming alone. Do not lock yourself in a room alone (i.e. bathroom). When caring for infants or small children, sit down when holding, feeding, or changing them to minimize risk of injury to the child in the event you have a seizure. Maintain good sleep hygiene. Avoid alcohol .  Also recommend adequate sleep, hydration, good diet and minimize stress.   During the Seizure  - First, ensure adequate ventilation and place patients on the floor on their left side  Loosen clothing around the neck and ensure the airway is patent. If the patient is clenching the teeth, do not force the mouth open with any object as this can cause severe damage - Remove all items from the surrounding that can be hazardous. The patient may be  oblivious to what's happening and may not even know what he or she is doing. If the patient is confused and wandering, either gently guide him/her away and block access to outside areas - Reassure the individual and be comforting - Call 911. In most cases, the seizure ends before EMS arrives. However, there are cases when seizures may last over 3 to 5 minutes. Or the individual may have developed breathing difficulties or severe injuries. If a pregnant patient or a person with diabetes develops a seizure, it is prudent to call an ambulance. - Finally, if the patient does not regain full consciousness, then call EMS. Most patients will remain confused for about 45 to 90 minutes after a seizure, so you must use judgment in calling for help. - Avoid restraints but make sure the patient is in a bed with padded side rails - Place the individual in a lateral position with the neck slightly flexed; this will help the saliva drain from the mouth and prevent the tongue from falling backward - Remove all nearby furniture and other hazards from the area - Provide verbal assurance as the individual is regaining consciousness - Provide the patient with privacy if possible - Call for help and start treatment as ordered by the caregiver   After the Seizure (Postictal Stage)  After a seizure, most patients experience confusion, fatigue, muscle pain and/or a headache. Thus, one should permit the individual to sleep. For the next few days, reassurance is essential. Being calm and helping reorient the person is also of importance.  Most seizures are painless and end spontaneously. Seizures are not harmful to others but can lead to complications such as stress on the lungs, brain and the heart. Individuals with prior lung problems may develop labored breathing and respiratory distress.    Discussed Patients with epilepsy have a small risk of sudden unexpected death, a condition referred to as sudden unexpected death  in  epilepsy (SUDEP). SUDEP is defined specifically as the sudden, unexpected, witnessed or unwitnessed, nontraumatic and nondrowning death in patients with epilepsy with or without evidence for a seizure, and excluding documented status epilepticus, in which post mortem examination does not reveal a structural or toxicologic cause for death     Orders Placed This Encounter  Procedures   Levetiracetam  level   EEG adult    Meds ordered this encounter  Medications   levETIRAcetam  (KEPPRA ) 500 MG tablet    Sig: Take 1 tablet (500 mg total) by mouth 2 (two) times daily.    Dispense:  180 tablet    Refill:  3    Return in about 6 months (around 06/29/2024).    Pastor Falling, MD 12/31/2023, 4:00 PM  [facility] [address], [address] [place], [place] [address] [phone]

## 2024-01-01 ENCOUNTER — Ambulatory Visit: Payer: Self-pay | Admitting: Neurology

## 2024-01-01 LAB — LEVETIRACETAM LEVEL: Levetiracetam Lvl: 14.4 ug/mL (ref 10.0–40.0)

## 2024-01-07 DIAGNOSIS — H26493 Other secondary cataract, bilateral: Secondary | ICD-10-CM | POA: Diagnosis not present

## 2024-01-27 ENCOUNTER — Encounter: Payer: Self-pay | Admitting: Neurology

## 2024-01-27 ENCOUNTER — Telehealth: Payer: Self-pay | Admitting: *Deleted

## 2024-01-27 ENCOUNTER — Ambulatory Visit (INDEPENDENT_AMBULATORY_CARE_PROVIDER_SITE_OTHER): Admitting: Neurology

## 2024-01-27 DIAGNOSIS — G40109 Localization-related (focal) (partial) symptomatic epilepsy and epileptic syndromes with simple partial seizures, not intractable, without status epilepticus: Secondary | ICD-10-CM

## 2024-01-27 NOTE — Telephone Encounter (Signed)
 Letter sent to patient via MyChart.

## 2024-01-27 NOTE — Procedures (Signed)
" ° ° °  History:  78 year old man with seizure   EEG classification: Awake and drowsy  Duration: 27 minutes   Technical aspects: This EEG study was done with scalp electrodes positioned according to the 10-20 International system of electrode placement. Electrical activity was reviewed with band pass filter of 1-70Hz , sensitivity of 7 uV/mm, display speed of 18mm/sec with a 60Hz  notched filter applied as appropriate. EEG data were recorded continuously and digitally stored.   Description of the recording: The background rhythms of this recording consists of a fairly well modulated medium amplitude alpha rhythm of 9 Hz that is reactive to eye opening and closure. Present in the anterior head region is a 15-20 Hz beta activity. Photic stimulation was performed, did not show any abnormalities. Hyperventilation was not performed. Drowsiness was manifested by background fragmentation. No abnormal epileptiform discharges seen during this recording. There was continuous right hemispheric slowing, worse in the central and temporal region. There were no electrographic seizure identified.   Abnormality: Continuous right hemispheric slowing, maximal in the central and temporal regions  Impression: This EEG is suggestive of neuronal dysfunction, maximal in the right central and temporal regions, etiology nonspecific but likely related to structural abnormalities.    Pastor Falling, MD [facility]  "
